# Patient Record
Sex: Male | Born: 1965 | Race: White | Hispanic: No | Marital: Married | Smoking: Current every day smoker
Health system: Southern US, Community
[De-identification: ages and names within clinical notes are randomized; demographics above are authoritative.]

## PROBLEM LIST (undated history)

## (undated) DIAGNOSIS — F32A Depression, unspecified: Secondary | ICD-10-CM

## (undated) DIAGNOSIS — G43909 Migraine, unspecified, not intractable, without status migrainosus: Secondary | ICD-10-CM

## (undated) DIAGNOSIS — R7303 Prediabetes: Secondary | ICD-10-CM

## (undated) DIAGNOSIS — E785 Hyperlipidemia, unspecified: Secondary | ICD-10-CM

## (undated) DIAGNOSIS — Z720 Tobacco use: Secondary | ICD-10-CM

---

## 2005-06-20 ENCOUNTER — Emergency Department: Payer: Self-pay | Admitting: Unknown Physician Specialty

## 2007-12-19 ENCOUNTER — Emergency Department: Payer: Self-pay | Admitting: Emergency Medicine

## 2008-06-18 ENCOUNTER — Emergency Department: Payer: Self-pay

## 2009-10-07 ENCOUNTER — Emergency Department: Payer: Self-pay | Admitting: Emergency Medicine

## 2011-12-18 ENCOUNTER — Emergency Department: Payer: Self-pay | Admitting: Emergency Medicine

## 2014-05-24 ENCOUNTER — Emergency Department: Payer: Self-pay | Admitting: Emergency Medicine

## 2014-05-25 LAB — BASIC METABOLIC PANEL
Anion Gap: 12 (ref 7–16)
BUN: 10 mg/dL (ref 7–18)
CHLORIDE: 108 mmol/L — AB (ref 98–107)
CO2: 25 mmol/L (ref 21–32)
Calcium, Total: 8.5 mg/dL (ref 8.5–10.1)
Creatinine: 1.45 mg/dL — ABNORMAL HIGH (ref 0.60–1.30)
EGFR (African American): >60
EGFR (Non-African Amer.): 55 — ABNORMAL LOW
Glucose: 81 mg/dL (ref 65–99)
Osmolality: 287 (ref 275–301)
Potassium: 3.5 mmol/L (ref 3.5–5.1)
Sodium: 145 mmol/L (ref 136–145)

## 2014-05-25 LAB — CBC
HCT: 47.6 % (ref 40.0–52.0)
HGB: 16.3 g/dL (ref 13.0–18.0)
MCH: 33.7 pg (ref 26.0–34.0)
MCHC: 34.3 g/dL (ref 32.0–36.0)
MCV: 98 fL (ref 80–100)
Platelet: 254 10*3/uL (ref 150–440)
RBC: 4.85 10*6/uL (ref 4.40–5.90)
RDW: 12.8 % (ref 11.5–14.5)
WBC: 9.1 10*3/uL (ref 3.8–10.6)

## 2014-05-25 LAB — TROPONIN I

## 2014-05-25 LAB — ETHANOL: Ethanol: 194 mg/dL (ref 0–80)

## 2014-05-26 ENCOUNTER — Emergency Department: Payer: Self-pay | Admitting: Emergency Medicine

## 2014-05-26 LAB — COMPREHENSIVE METABOLIC PANEL
Albumin: 3.9 g/dL (ref 3.4–5.0)
Alkaline Phosphatase: 101 U/L
Anion Gap: 6 — ABNORMAL LOW (ref 7–16)
BUN: 11 mg/dL (ref 7–18)
Bilirubin,Total: 0.4 mg/dL (ref 0.2–1.0)
Calcium, Total: 9.2 mg/dL (ref 8.5–10.1)
Chloride: 105 mmol/L (ref 98–107)
Co2: 26 mmol/L (ref 21–32)
Creatinine: 0.87 mg/dL (ref 0.60–1.30)
EGFR (African American): >60
EGFR (Non-African Amer.): >60
Glucose: 95 mg/dL (ref 65–99)
Osmolality: 273 (ref 275–301)
Potassium: 4.8 mmol/L (ref 3.5–5.1)
SGOT(AST): 31 U/L (ref 15–37)
SGPT (ALT): 32 U/L
Sodium: 137 mmol/L (ref 136–145)
Total Protein: 7.8 g/dL (ref 6.4–8.2)

## 2014-05-26 LAB — LIPASE, BLOOD: Lipase: 193 U/L (ref 73–393)

## 2014-05-26 LAB — CBC
HCT: 46.9 % (ref 40.0–52.0)
HGB: 15.6 g/dL (ref 13.0–18.0)
MCH: 33.2 pg (ref 26.0–34.0)
MCHC: 33.3 g/dL (ref 32.0–36.0)
MCV: 100 fL (ref 80–100)
PLATELETS: 221 10*3/uL (ref 150–440)
RBC: 4.71 10*6/uL (ref 4.40–5.90)
RDW: 12.8 % (ref 11.5–14.5)
WBC: 11.9 10*3/uL — AB (ref 3.8–10.6)

## 2014-05-26 LAB — MAGNESIUM: Magnesium: 2.1 mg/dL

## 2017-03-30 ENCOUNTER — Emergency Department
Admission: EM | Admit: 2017-03-30 | Discharge: 2017-03-30 | Disposition: A | Discharge status: Home / Self Care | Payer: Non-veteran care | Attending: Emergency Medicine | Admitting: Emergency Medicine

## 2017-03-30 ENCOUNTER — Encounter: Payer: Self-pay | Admitting: *Deleted

## 2017-03-30 ENCOUNTER — Emergency Department: Payer: Non-veteran care

## 2017-03-30 DIAGNOSIS — R51 Headache: Secondary | ICD-10-CM | POA: Diagnosis not present

## 2017-03-30 DIAGNOSIS — F17210 Nicotine dependence, cigarettes, uncomplicated: Secondary | ICD-10-CM | POA: Insufficient documentation

## 2017-03-30 DIAGNOSIS — M6281 Muscle weakness (generalized): Secondary | ICD-10-CM | POA: Diagnosis present

## 2017-03-30 DIAGNOSIS — R634 Abnormal weight loss: Secondary | ICD-10-CM | POA: Insufficient documentation

## 2017-03-30 DIAGNOSIS — R5383 Other fatigue: Secondary | ICD-10-CM | POA: Diagnosis not present

## 2017-03-30 DIAGNOSIS — D3501 Benign neoplasm of right adrenal gland: Secondary | ICD-10-CM | POA: Diagnosis not present

## 2017-03-30 DIAGNOSIS — D3500 Benign neoplasm of unspecified adrenal gland: Secondary | ICD-10-CM

## 2017-03-30 LAB — COMPREHENSIVE METABOLIC PANEL
ALT: 13 U/L — ABNORMAL LOW (ref 17–63)
ANION GAP: 8 (ref 5–15)
AST: 20 U/L (ref 15–41)
Albumin: 4.3 g/dL (ref 3.5–5.0)
Alkaline Phosphatase: 66 U/L (ref 38–126)
BUN: 15 mg/dL (ref 6–20)
CHLORIDE: 106 mmol/L (ref 101–111)
CO2: 25 mmol/L (ref 22–32)
Calcium: 9.1 mg/dL (ref 8.9–10.3)
Creatinine, Ser: 0.95 mg/dL (ref 0.61–1.24)
Glucose, Bld: 110 mg/dL — ABNORMAL HIGH (ref 65–99)
POTASSIUM: 3.9 mmol/L (ref 3.5–5.1)
SODIUM: 139 mmol/L (ref 135–145)
Total Bilirubin: 0.6 mg/dL (ref 0.3–1.2)
Total Protein: 7.4 g/dL (ref 6.5–8.1)

## 2017-03-30 LAB — CBC WITH DIFFERENTIAL/PLATELET
BASOS ABS: 0 10*3/uL (ref 0–0.1)
Basophils Relative: 1 %
EOS PCT: 2 %
Eosinophils Absolute: 0.2 10*3/uL (ref 0–0.7)
HCT: 42 % (ref 40.0–52.0)
Hemoglobin: 14.9 g/dL (ref 13.0–18.0)
LYMPHS ABS: 1.3 10*3/uL (ref 1.0–3.6)
LYMPHS PCT: 16 %
MCH: 33.5 pg (ref 26.0–34.0)
MCHC: 35.5 g/dL (ref 32.0–36.0)
MCV: 94.5 fL (ref 80.0–100.0)
MONO ABS: 0.5 10*3/uL (ref 0.2–1.0)
Monocytes Relative: 6 %
Neutro Abs: 6.5 10*3/uL (ref 1.4–6.5)
Neutrophils Relative %: 75 %
PLATELETS: 253 10*3/uL (ref 150–440)
RBC: 4.44 MIL/uL (ref 4.40–5.90)
RDW: 12.5 % (ref 11.5–14.5)
WBC: 8.6 10*3/uL (ref 3.8–10.6)

## 2017-03-30 LAB — URINALYSIS, COMPLETE (UACMP) WITH MICROSCOPIC
BILIRUBIN URINE: NEGATIVE
Bacteria, UA: NONE SEEN
GLUCOSE, UA: NEGATIVE mg/dL
HGB URINE DIPSTICK: NEGATIVE
KETONES UR: NEGATIVE mg/dL
LEUKOCYTES UA: NEGATIVE
NITRITE: NEGATIVE
PH: 6 (ref 5.0–8.0)
Protein, ur: NEGATIVE mg/dL
RBC / HPF: NONE SEEN RBC/hpf (ref 0–5)
SQUAMOUS EPITHELIAL / LPF: NONE SEEN
Specific Gravity, Urine: 1.006 (ref 1.005–1.030)

## 2017-03-30 LAB — TROPONIN I: Troponin I: <0.03 ng/mL (ref <0.03)

## 2017-03-30 LAB — MAGNESIUM: MAGNESIUM: 2.1 mg/dL (ref 1.7–2.4)

## 2017-03-30 LAB — ETHANOL

## 2017-03-30 MED ORDER — IOPAMIDOL (ISOVUE-300) INJECTION 61%
100.0000 mL | Freq: Once | INTRAVENOUS | Status: AC | PRN
  Administered 2017-03-30: 100 mL via INTRAVENOUS

## 2017-03-30 MED ORDER — IOPAMIDOL (ISOVUE-300) INJECTION 61%
30.0000 mL | Freq: Once | INTRAVENOUS | Status: AC | PRN
  Administered 2017-03-30: 30 mL via ORAL

## 2017-03-30 NOTE — Discharge Instructions (Signed)
We are very reassured by your findings here, we do noticed that you have what is likely benign adrenal adenomas, this will require follow-up with our primary care doctor. Also, you  likely should have your thyroid test performed. He would prefer not to have that done here at this time, your doctor can certainly do this for you.At this time, we see no evidence of acute cancer or other pathology causing you to lose weight, however, we do recommend close outpatient follow-up. If you have any chest pain shortness of breath abdominal pain vomiting fever or you feel worse in any way please return to medical attention

## 2017-03-30 NOTE — ED Notes (Signed)
Patient transported to CT 

## 2017-03-30 NOTE — ED Provider Notes (Addendum)
[facility] Emergency Department Provider Note  ____________________________________________   I have reviewed the triage vital signs and the nursing notes.   HISTORY  Chief Complaint Weakness and Fatigue    HPI Eric Garza is a 51 y.o. male Who states he has no significant medical problem is. He is a smoker. He is a Psychologist, clinical. He states that he has lost 40 pounds without any effort in the last 6 months. He states he has had no melena bright red blood per rectum no hematemesis no chest pain or short of breath or cough. He states he sometimes has headaches and he has not had any headaches for several weeks that he did have a cluster headaches several weeks ago.he has not had any day since then. He denies any stiff neck fever or chills. Patient states he feels generally weak but not specifically weak in any one part of his body. He went to the [place] several times to have this evaluated, they have not figured it out. Apparently did do blood work 2 days ago and did not tell the results yet and they did an x-ray yesterday the results of which he does not know. [facility] does not participate in the Federal mandated sharing of information that applied other hospitals and this information is not available.     History reviewed. No pertinent past medical history.  There are no active problems to display for this patient.   History reviewed. No pertinent surgical history.  Prior to Admission medications   Not on File    Allergies Patient has no known allergies.  No family history on file.  Social History Social History  Substance Use Topics  . Smoking status: Current Every Day Smoker    Packs/day: 1.00    Types: Cigarettes  . Smokeless tobacco: Not on file  . Alcohol use No    Review of Systems Constitutional: No fever/chills Eyes: No visual changes. ENT: No sore throat. No stiff neck no neck pain Cardiovascular: Denies chest pain. Respiratory: Denies shortness of  breath. Gastrointestinal:   no vomiting.  No diarrhea.  No constipation. Genitourinary: Negative for dysuria. Musculoskeletal: Negative lower extremity swelling Skin: Negative for rash. Neurological: Negative for severe headaches, focal weakness or numbness.   ____________________________________________   PHYSICAL EXAM:  VITAL SIGNS: ED Triage Vitals  Enc Vitals Group     BP 03/30/17 0742 (!) 157/97     Pulse Rate 03/30/17 0742 83     Resp 03/30/17 0742 18     Temp 03/30/17 0742 98 F (36.7 C)     Temp Source 03/30/17 0742 Oral     SpO2 03/30/17 0742 98 %     Weight 03/30/17 0743 153 lb (69.4 kg)     Height 03/30/17 0743 5\' 7"  (1.702 m)     Head Circumference --      Peak Flow --      Pain Score 03/30/17 0800 0     Pain Loc --      Pain Edu? --      Excl. in [place]? --     Constitutional: Alert and oriented. Well appearing and in no acute distress. Eyes: Conjunctivae are normal Head: Atraumatic HEENT: No congestion/rhinnorhea. Mucous membranes are moist.  Oropharynx non-erythematous Neck:   Nontender with no meningismus, no masses, no stridor Cardiovascular: Normal rate, regular rhythm. Grossly normal heart sounds.  Good peripheral circulation. Respiratory: Normal respiratory effort.  No retractions. Lungs CTAB. Abdominal: Soft and nontender. No distention. No  guarding no rebound Back:  There is no focal tenderness or step off.  there is no midline tenderness there are no lesions noted. there is no CVA tenderness GU: Normal showman genitalia no lesions masses or pain Musculoskeletal: No lower extremity tenderness, no upper extremity tenderness. No joint effusions, no DVT signs strong distal pulses no edema Neurologic:  Normal speech and language. No gross focal neurologic deficits are appreciated.  Skin:  Skin is warm, dry and intact. No rash noted. Psychiatric: Mood and affect are normal. Speech and behavior are normal.  ____________________________________________    LABS (all labs ordered are listed, but only abnormal results are displayed)  Labs Reviewed  COMPREHENSIVE METABOLIC PANEL - Abnormal; Notable for the following:       Result Value   Glucose, Bld 110 (*)    ALT 13 (*)    All other components within normal limits  URINALYSIS, COMPLETE (UACMP) WITH MICROSCOPIC - Abnormal; Notable for the following:    Color, Urine YELLOW (*)    APPearance CLEAR (*)    All other components within normal limits  CBC WITH DIFFERENTIAL/PLATELET  MAGNESIUM  ETHANOL   ____________________________________________  EKG  I personally interpreted any EKGs ordered by me or triage Sinus rhythm at 72 bpm normal axis no acute ST elevation or depression, PACs noted, nonspecific ST changes no acute ischemia ____________________________________________  RADIOLOGY  I reviewed any imaging ordered by me or triage that were performed during my shift and, if possible, patient and/or family made aware of any abnormal findings. ____________________________________________   PROCEDURES  Procedure(s) performed: None  Procedures  Critical Care performed: None  ____________________________________________   INITIAL IMPRESSION / ASSESSMENT AND PLAN / ED COURSE  Pertinent labs & imaging results that were available during my care of the patient were reviewed by me and considered in my medical decision making (see chart for details).  Patient here with generalized weakness and 40 pound weight loss over the course of 6 months. Concern for cancer especially given his history of tobacco abuse. Blood work is reassuring vital signs are reassuring exam is reassuring,patient apparently saw having frustration with his primary care doctor who is not been able to figure this out. We will obtain diffuse imaging CT of the head for hisheadaches, CT of the chest because of weight loss and questionable lesion on chest x-ray, CT the abdomen and pelvis for weight loss and will  reassess.  ----------------------------------------- 12:05 PM on 03/30/2017 -----------------------------------------  CT head, chest and pelvis all negative for cancer or other cause of patient's weight loss. Patient denies depression SI or HI, he may need thyroid testing that he would prefer not to stay for that today, he would like to go home. He will follow up with the [place] for that. He is made aware of all the findings on CT scan. He is in no acute distress and continues to have no complaints or concerns. We will discharge him. I did send a troponin as a precaution, but I have low suspicion that this weight loss represents ACS, nor is there any signs of ischemic gut. Additionally, glucoseis 110.    ____________________________________________   FINAL CLINICAL IMPRESSION(S) / ED DIAGNOSES  Final diagnoses:  None      This chart was dictated using voice recognition software.  Despite best efforts to proofread,  errors can occur which can change meaning.      Schuyler Amor, MD 03/30/17 1104    Schuyler Amor, MD 03/30/17 [phone]

## 2017-03-30 NOTE — ED Notes (Signed)
Labs sent to lab

## 2017-03-30 NOTE — ED Triage Notes (Signed)
Pt complains of generalized weakness for 3 weeks, pt reports a 40 pound weight loss in the last 6 months, pt is currently being seen at the [place], pt reports drinking large amounts of ETOH until 1 year ago, pt reports having labs yesterday at the [place], pt denies pain, pt states " the [place] is not helping me"

## 2017-07-04 ENCOUNTER — Emergency Department
Admission: EM | Admit: 2017-07-04 | Discharge: 2017-07-04 | Disposition: A | Discharge status: Home / Self Care | Payer: Non-veteran care | Attending: Emergency Medicine | Admitting: Emergency Medicine

## 2017-07-04 ENCOUNTER — Other Ambulatory Visit: Payer: Self-pay

## 2017-07-04 ENCOUNTER — Emergency Department: Payer: Non-veteran care

## 2017-07-04 ENCOUNTER — Encounter: Payer: Self-pay | Admitting: *Deleted

## 2017-07-04 DIAGNOSIS — Y829 Unspecified medical devices associated with adverse incidents: Secondary | ICD-10-CM | POA: Insufficient documentation

## 2017-07-04 DIAGNOSIS — T887XXA Unspecified adverse effect of drug or medicament, initial encounter: Secondary | ICD-10-CM | POA: Diagnosis not present

## 2017-07-04 DIAGNOSIS — R079 Chest pain, unspecified: Secondary | ICD-10-CM | POA: Diagnosis present

## 2017-07-04 DIAGNOSIS — F17210 Nicotine dependence, cigarettes, uncomplicated: Secondary | ICD-10-CM | POA: Diagnosis not present

## 2017-07-04 DIAGNOSIS — T402X5A Adverse effect of other opioids, initial encounter: Secondary | ICD-10-CM | POA: Diagnosis not present

## 2017-07-04 DIAGNOSIS — R0789 Other chest pain: Secondary | ICD-10-CM

## 2017-07-04 LAB — CBC
HEMATOCRIT: 39.6 % — AB (ref 40.0–52.0)
Hemoglobin: 13.9 g/dL (ref 13.0–18.0)
MCH: 33.3 pg (ref 26.0–34.0)
MCHC: 35 g/dL (ref 32.0–36.0)
MCV: 95 fL (ref 80.0–100.0)
Platelets: 267 10*3/uL (ref 150–440)
RBC: 4.17 MIL/uL — ABNORMAL LOW (ref 4.40–5.90)
RDW: 12.5 % (ref 11.5–14.5)
WBC: 12 10*3/uL — ABNORMAL HIGH (ref 3.8–10.6)

## 2017-07-04 LAB — BASIC METABOLIC PANEL
Anion gap: 8 (ref 5–15)
BUN: 18 mg/dL (ref 6–20)
CALCIUM: 9 mg/dL (ref 8.9–10.3)
CO2: 28 mmol/L (ref 22–32)
Chloride: 102 mmol/L (ref 101–111)
Creatinine, Ser: 0.99 mg/dL (ref 0.61–1.24)
GFR calc non Af Amer: >60 mL/min (ref >60)
GLUCOSE: 90 mg/dL (ref 65–99)
Potassium: 3.6 mmol/L (ref 3.5–5.1)
Sodium: 138 mmol/L (ref 135–145)

## 2017-07-04 LAB — TROPONIN I: Troponin I: <0.03 ng/mL (ref <0.03)

## 2017-07-04 MED ORDER — KETOROLAC TROMETHAMINE 10 MG PO TABS: 10.0000 mg | ORAL_TABLET | Freq: Four times a day (QID) | ORAL | 0 refills | Status: DC | PRN

## 2017-07-04 MED ORDER — KETOROLAC TROMETHAMINE 10 MG PO TABS
10.0000 mg | ORAL_TABLET | Freq: Once | ORAL | Status: AC
  Administered 2017-07-04: 10 mg via ORAL
  Filled 2017-07-04: qty 1

## 2017-07-04 NOTE — ED Provider Notes (Signed)
[facility] Emergency Department Provider Note  ____________________________________________  Time seen: Approximately 9:51 PM  I have reviewed the triage vital signs and the nursing notes.   HISTORY  Chief Complaint Chest Pain and Shortness of Breath    HPI Eric Garza is a 51 y.o. male with a history of PTSD and right carpal tunnel surgery earlier today who complained of an onset of chest pain or shortness of breath around 6:30 PM today. Patient had uneventful outpatient procedure at the [place], return home. He was prescribed oxycodone 5 mg tablets to take 1 as needed. However, around 4:00 today, he took 2 tablets for his first dose. He then developed the chest pain and shortness of breath. The chest pain is described as tightness, nonradiating, no aggravating or alleviating factors. No diaphoresis or vomiting.  Apart from the recent outpatient surgery today, no recent travel trauma or hospitalizations. No history of DVT or PE.  While waiting to be seen, the patient reports that his symptoms have completely resolved. He is asymptomatic at this time. Concurrently, his right wrist pain at the surgical site is getting worse again      Past medical history PTSD    Past surgical history Right carpal tunnel release  Prior to Admission medications   Medication Sig Start Date End Date Taking? Authorizing Provider  ketorolac (TORADOL) 10 MG tablet Take 1 tablet (10 mg total) by mouth every 6 (six) hours as needed for moderate pain. 07/04/17   Carrie Mew, MD     Allergies Patient has no known allergies.   No family history on file.  Social History Social History   Tobacco Use  . Smoking status: Current Every Garza Smoker    Packs/Garza: 1.00    Types: Cigarettes  . Smokeless tobacco: Never Used  Substance Use Topics  . Alcohol use: No  . Drug use: Not on file    Review of Systems  Constitutional:   No fever or chills.  ENT:   No sore throat.  No rhinorrhea. Cardiovascular:  Positive as above chest pain without palpitations or syncope. Respiratory:   Positive as above shortness of breath without cough. Gastrointestinal:   Negative for abdominal pain, vomiting and diarrhea.  Musculoskeletal:   Negative for focal pain or swelling All other systems reviewed and are negative except as documented above in ROS and HPI.  ____________________________________________   PHYSICAL EXAM:  VITAL SIGNS: ED Triage Vitals  Enc Vitals Group     BP 07/04/17 1911 (!) 151/96     Pulse Rate 07/04/17 1911 71     Resp 07/04/17 1911 20     Temp 07/04/17 1911 97.8 F (36.6 C)     Temp Source 07/04/17 1911 Oral     SpO2 07/04/17 1911 100 %     Weight 07/04/17 1911 175 lb (79.4 kg)     Height 07/04/17 1911 5\' 7"  (1.702 m)     Head Circumference --      Peak Flow --      Pain Score 07/04/17 2035 0     Pain Loc --      Pain Edu? --      Excl. in [place]? --     Vital signs reviewed, nursing assessments reviewed.   Constitutional:   Alert and oriented. Well appearing and in no distress. Eyes:   No scleral icterus.  EOMI. No nystagmus. No conjunctival pallor. PERRL. ENT   Head:   Normocephalic and atraumatic.   Nose:  No congestion/rhinnorhea.    Mouth/Throat:   MMM, no pharyngeal erythema. No peritonsillar mass.    Neck:   No meningismus. Full ROM. Hematological/Lymphatic/Immunilogical:   No cervical lymphadenopathy. Cardiovascular:   RRR. Symmetric bilateral radial and DP pulses.  No murmurs.  Respiratory:   Normal respiratory effort without tachypnea/retractions. Breath sounds are clear and equal bilaterally. No wheezes/rales/rhonchi. Gastrointestinal:   Soft and nontender. Non distended. There is no CVA tenderness.  No rebound, rigidity, or guarding. Genitourinary:   deferred Musculoskeletal:   Normal range of motion in all extremities except for right wrist which is in a surgical splint and bandage. No joint effusions.  No  lower extremity tenderness.  No edema. Neurologic:   Normal speech and language.  Motor grossly intact. No gross focal neurologic deficits are appreciated.   ____________________________________________    LABS (pertinent positives/negatives) (all labs ordered are listed, but only abnormal results are displayed) Labs Reviewed  CBC - Abnormal; Notable for the following components:      Result Value   WBC 12.0 (*)    RBC 4.17 (*)    HCT 39.6 (*)    All other components within normal limits  BASIC METABOLIC PANEL  TROPONIN I   ____________________________________________   EKG  Interpreted by me Normal sinus rhythm rate of 69, normal axis and intervals. Normal QRS ST segments and T waves. Voltage criteria for LVH in the high lateral leads.  ____________________________________________    [id number]  Dg Chest 2 View  Result Date: 07/04/2017 CLINICAL DATA:  Chest pain EXAM: CHEST  2 VIEW COMPARISON:  03/30/2017 FINDINGS: Mild bronchitic changes. No consolidation or effusion. Normal heart size. No pneumothorax. Degenerative changes of the spine. IMPRESSION: No active cardiopulmonary disease.  Mild bronchitic changes. Electronically Signed   By: Donavan Foil M.D.   On: 07/04/2017 19:45    ____________________________________________   PROCEDURES Procedures  ____________________________________________     CLINICAL IMPRESSION / ASSESSMENT AND PLAN / ED COURSE  Pertinent labs & imaging results that were available during my care of the patient were reviewed by me and considered in my medical decision making (see chart for details).   Patient presents with an onset of chest pain shortness of breath, atypical, after surgery. It is not pleuritic, not typical of PE despite the recent surgery. Other surgeries and a risk factor for PE, the patient has no evidence of DVT anywhere in the body, he had a relatively minor outpatient procedure, and his symptoms have resolved at the  same time as his oxycodone is worn off. If this were PE expect that his chest pain would worsen as the oxycodone wears off. Likely this is a histamine mediated reaction occurring from taking a relatively high dose of the oxycodone in an otherwise opioid nave person. Recommended he avoid the oxycodone as much as possible, and if he must take it try a half tablet or 1 tablet. In addition, I'll have him hold his chronic naproxen and use Toradol for the next few days as this may provide better non-opioid pain management too.      ____________________________________________   FINAL CLINICAL IMPRESSION(S) / ED DIAGNOSES    Final diagnoses:  Atypical chest pain  Adverse effect of other opioids, initial encounter      This SmartLink is deprecated. Use AVSMEDLIST instead to display the medication list for a patient.   Portions of this note were generated with dragon dictation software. Dictation errors may occur despite best attempts at proofreading.    Carrie Mew,  MD 07/04/17 2156

## 2017-07-04 NOTE — ED Notes (Signed)
Pt reports he was having shortness of breath and chest tightness after taking oxycodone - pt had surgery today and [facility] they advised [facility] come to [facility] ER for eval - pt denies any pain or shortness of breath at this time

## 2017-07-04 NOTE — Discharge Instructions (Signed)
Do not take naproxen, ibuprofen, or other NSAIDS while taking toradol.

## 2017-07-04 NOTE — ED Triage Notes (Signed)
Pt to triage via wheelchair.  Pt has chest tightness and sob since 1830 today.    Pt had surgery today at the [place] on his right wrist for carpel tunnel.   Pt has a bandage on right wrist.  cig smoker.  No cough.

## 2018-09-19 IMAGING — CR DG CHEST 2V
1 series · 2 of 2 positions shown · non-contrast
Comparison: 05/25/2014

CLINICAL DATA: 51-year-old male with a history of weakness and
blurred vision. History of smoking

EXAM:
CHEST  2 VIEW

[Series 1: dg chest 2 view · 0.14mm/px · 2 of 2 slices shown]
[im 1/2]
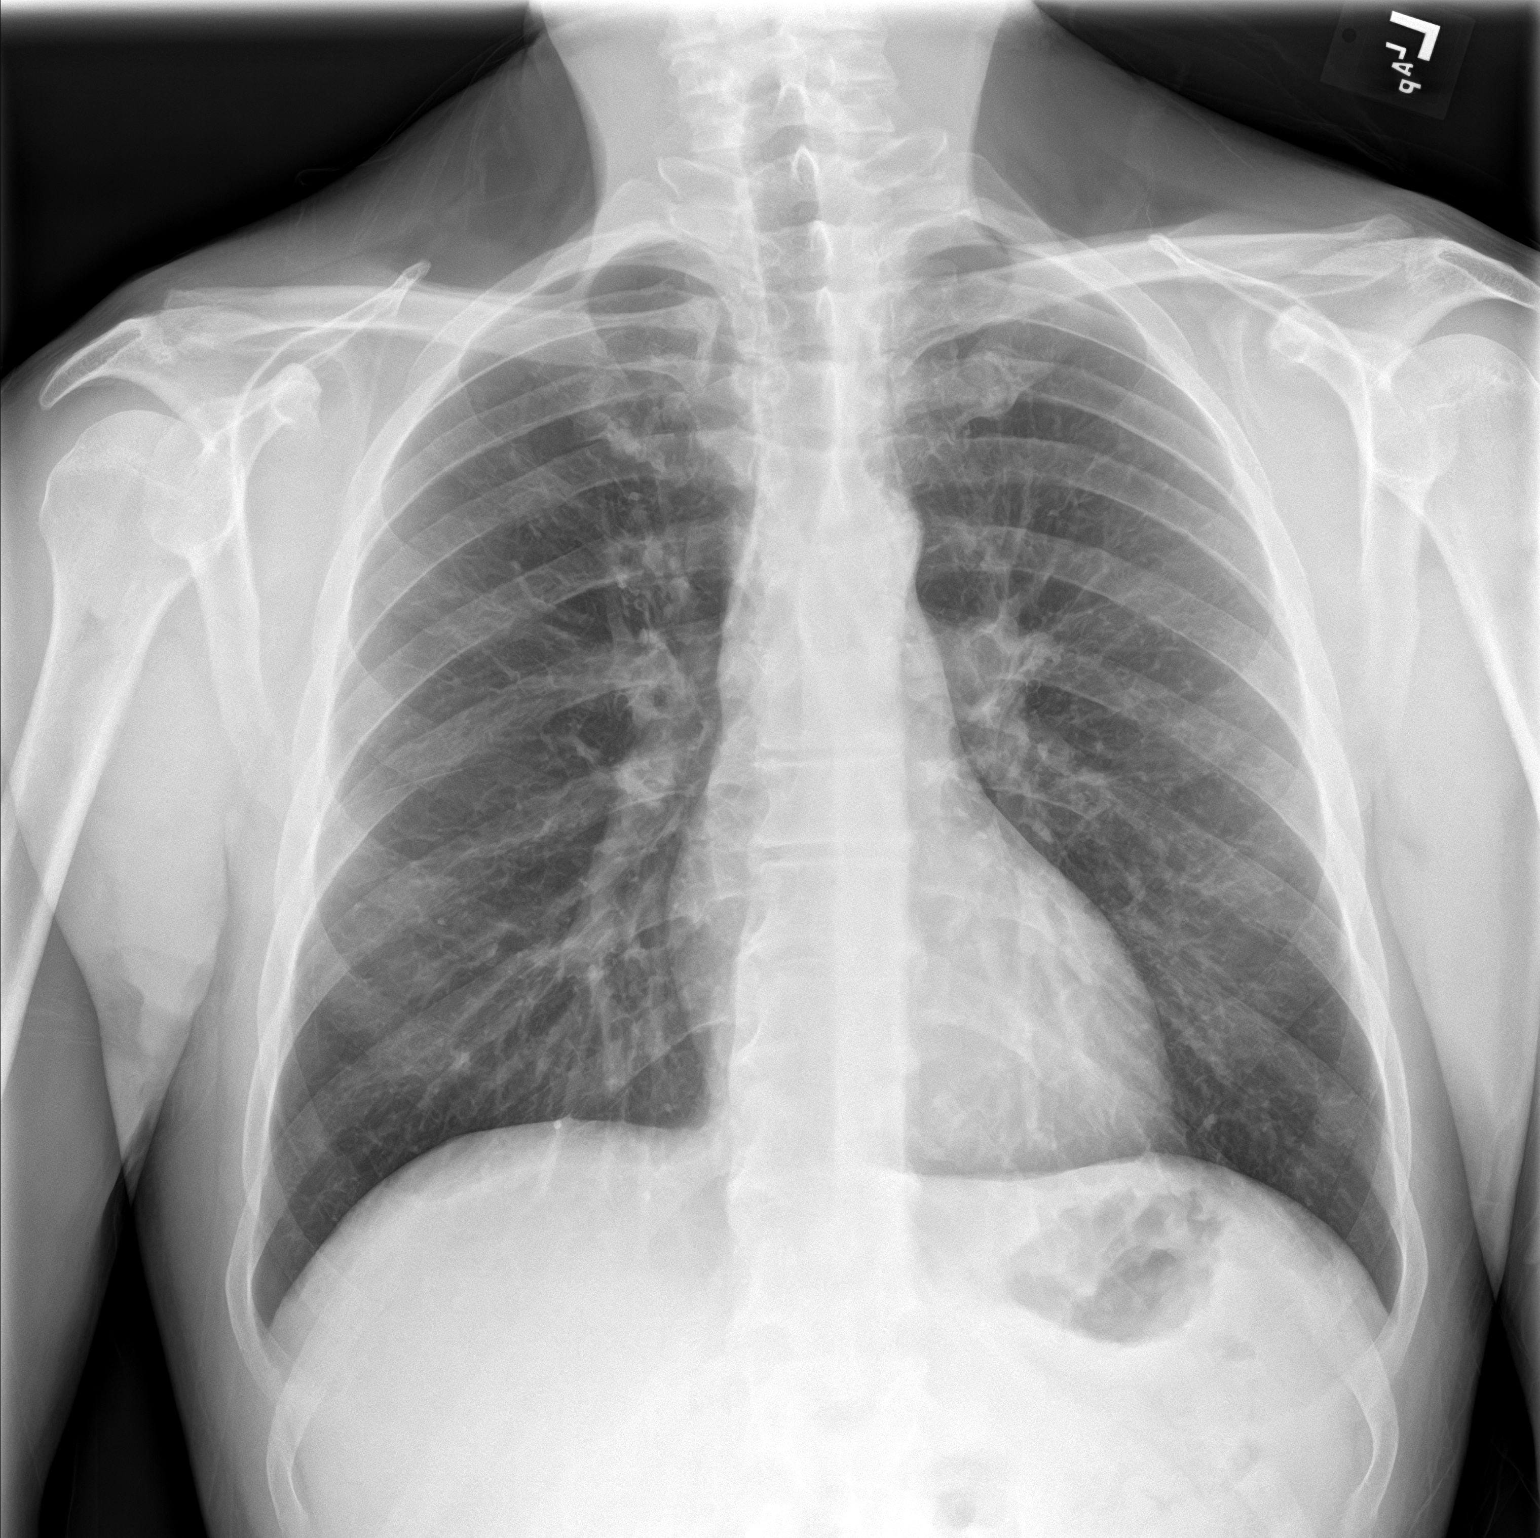
[im 2/2]
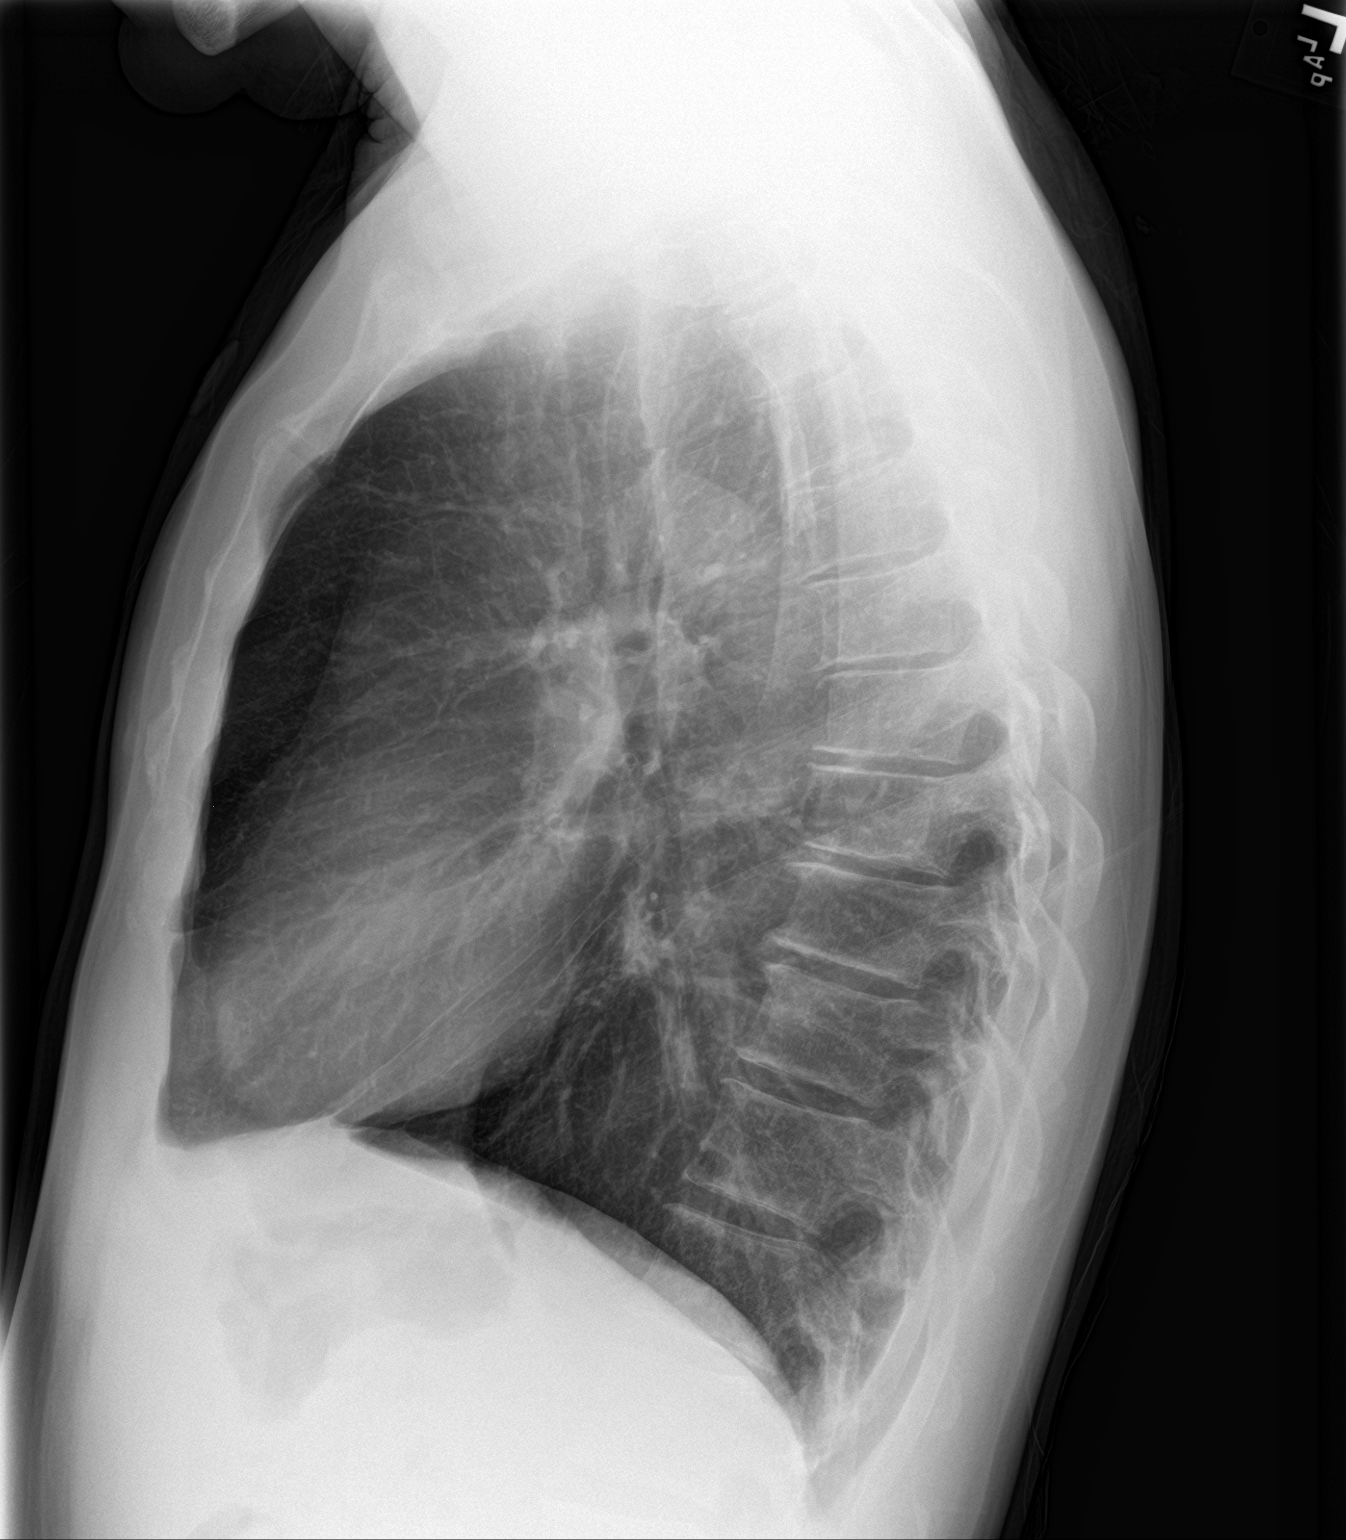

[2 of 2 positions shown; findings below may reference images not displayed]

FINDINGS: Cardiomediastinal silhouette unchanged in size and contour. No
evidence of central vascular congestion. No pneumothorax. No pleural
effusion. No confluent airspace disease.

Coarsened interstitial markings. There is questionable nodular
density at the right lower lung, in a region of overlying the
anterior fifth rib.

No displaced fracture.
IMPRESSION: No radiographic evidence of acute cardiopulmonary disease.

There is a questionable small nodule at the right lung base, which
could alternatively be related to the right rib head or chronic
scarring. Given the patient's history of smoking and chronic lung
changes, a repeat chest x-ray with apical lordotic positioning is
recommended in 4-6 weeks.

## 2018-09-19 IMAGING — CT CT HEAD W/O CM
3 series · 15 of 46 positions shown, 18 images · non-contrast
Comparison: None.

CLINICAL DATA: Generalized weakness for several weeks with weight
loss, initial encounter

EXAM:
CT HEAD WITHOUT CONTRAST
TECHNIQUE: Contiguous axial images were obtained from the base of the skull
through the vertex without intravenous contrast.

[Series 2: head wo · axial · 0.47mm/px · z∈[-107,+13]mm · 9 of 29 slices shown, 12 images]
[im 3/29  brain]
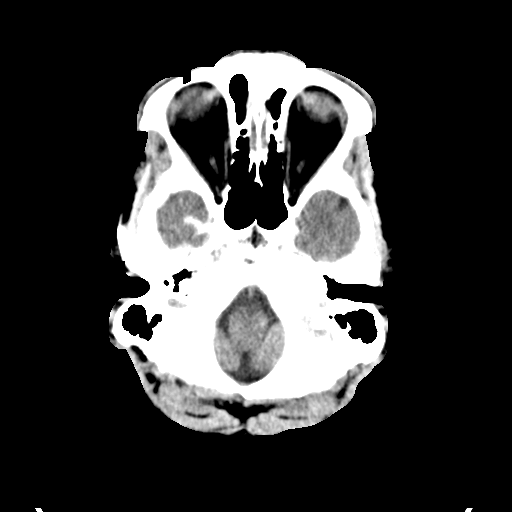
[im 3/29  bone]
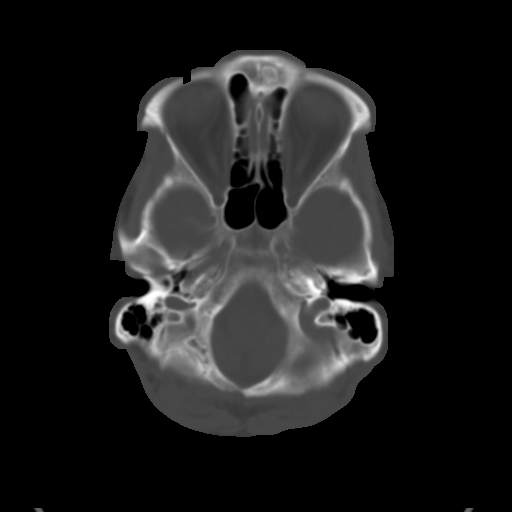
[im 6/29  brain]
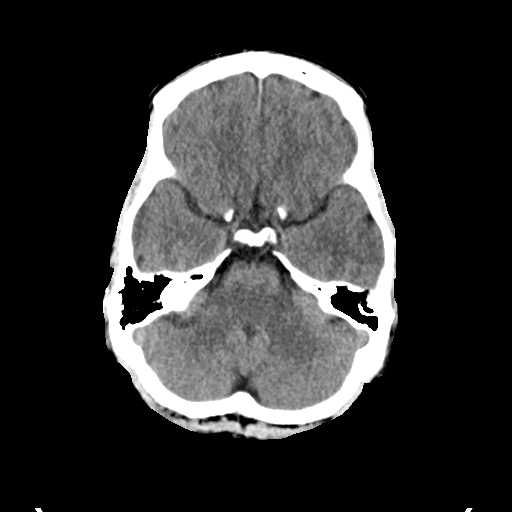
[im 9/29  brain]
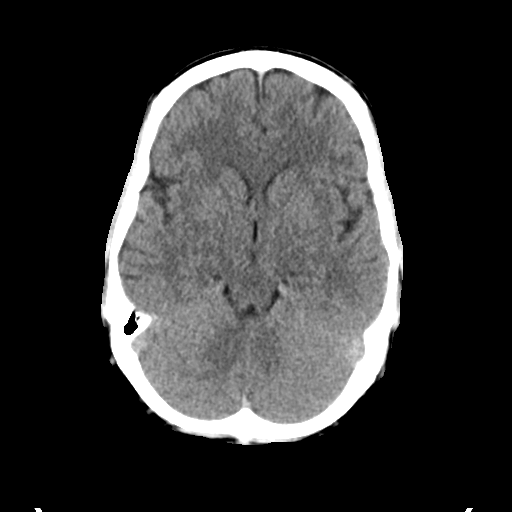
[im 12/29  brain]
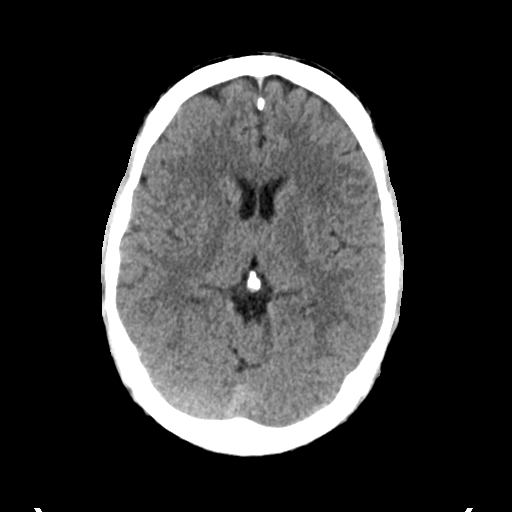
[im 15/29  brain]
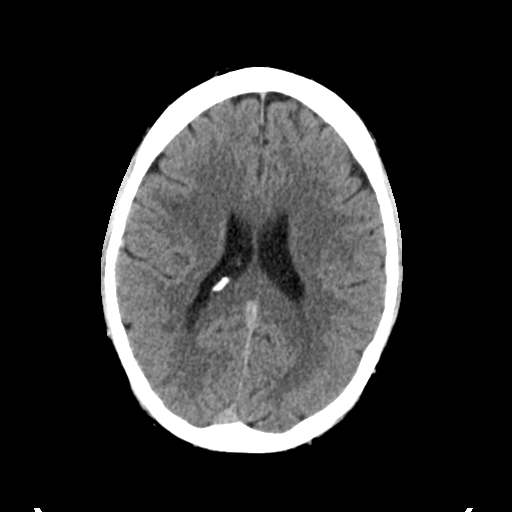
[im 15/29  bone]
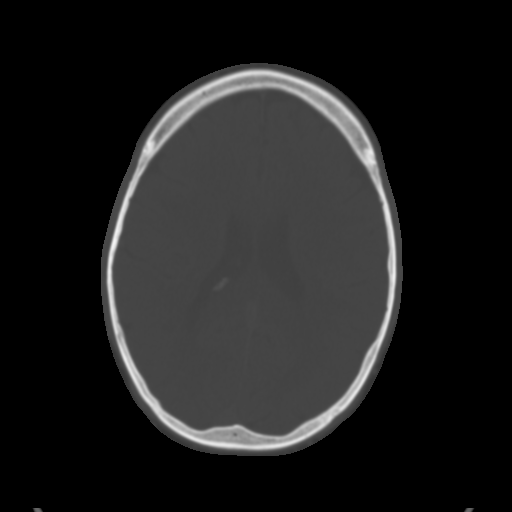
[im 18/29  brain]
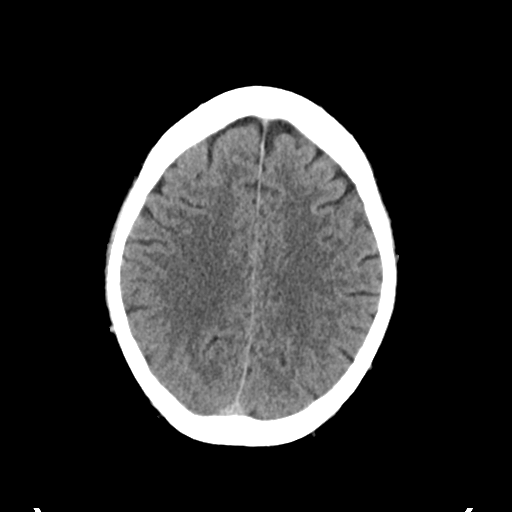
[im 21/29  brain]
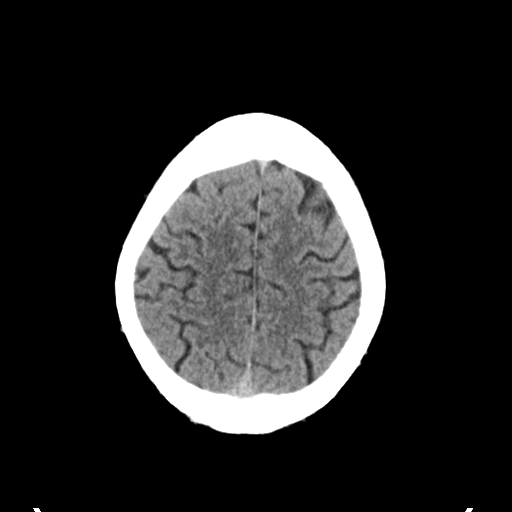
[im 24/29  brain]
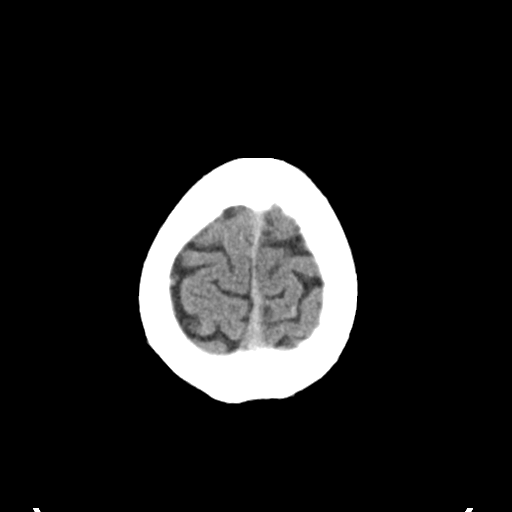
[im 27/29  brain]
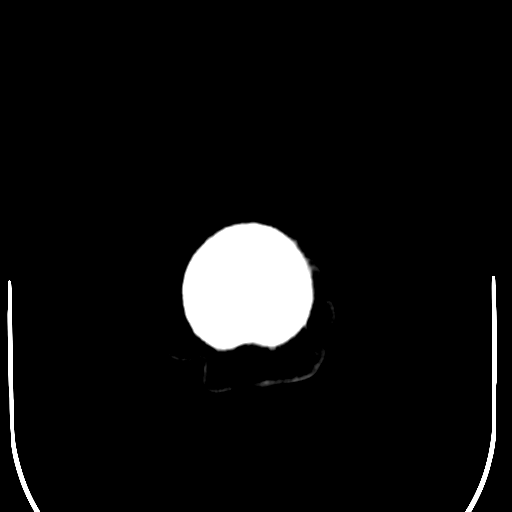
[im 27/29  bone]
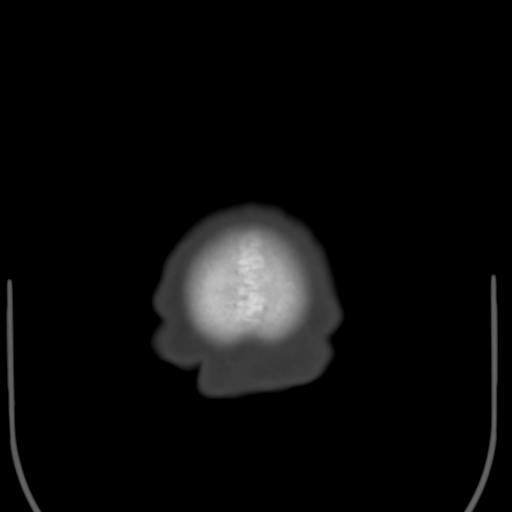

[Series 4: coronal soft tissue · coronal · 0.29mm/px · 3 of 68 slices shown]
[im 23/68  brain]
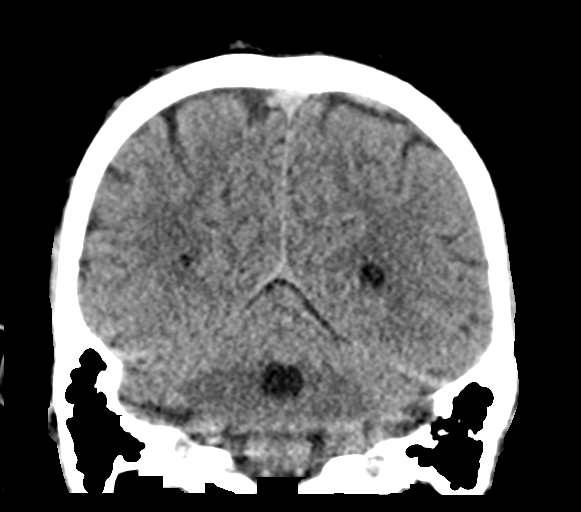
[im 30/68  brain]
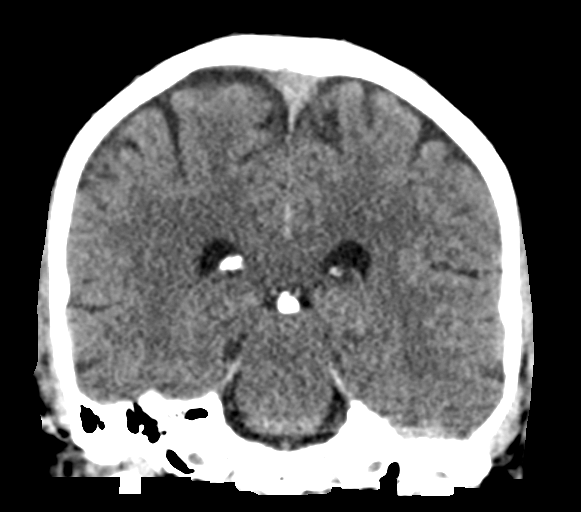
[im 38/68  brain]
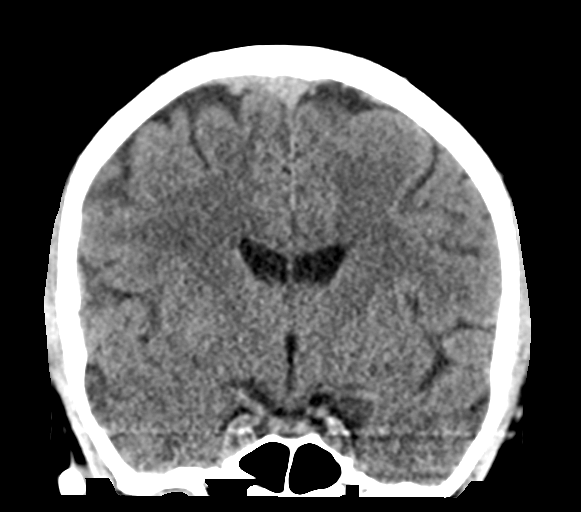

[Series 5: sagittal soft tissue · sagittal · 0.29mm/px · 3 of 56 slices shown]
[im 19/56  brain]
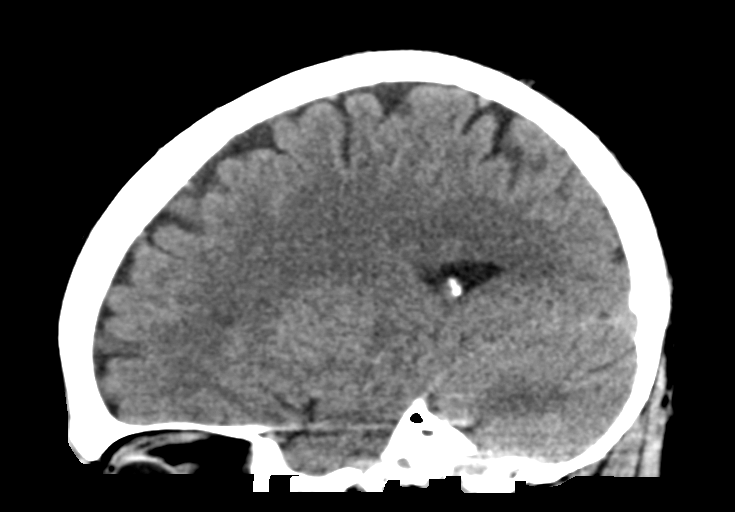
[im 28/56  brain]
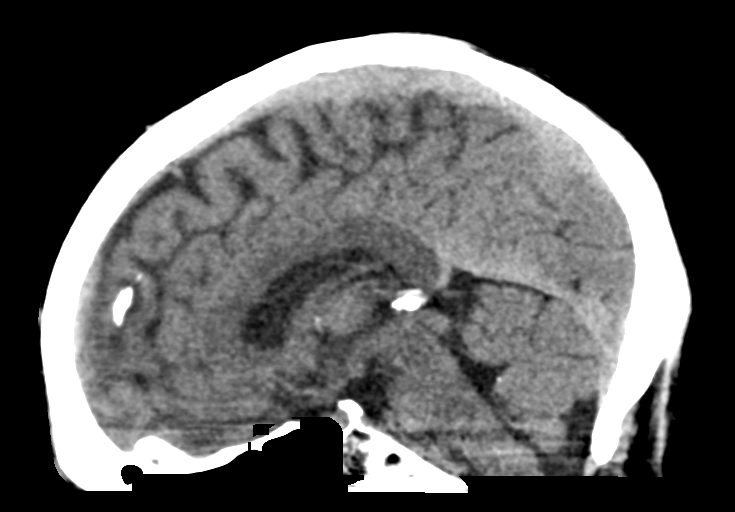
[im 37/56  brain]
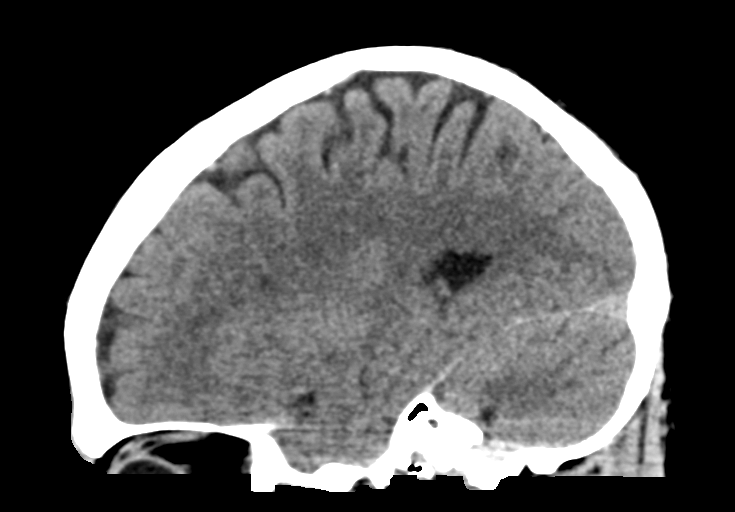

[15 of 46 positions shown; findings below may reference images not displayed]

FINDINGS: Brain: No evidence of acute infarction, hemorrhage, hydrocephalus,
extra-axial collection or mass lesion/mass effect.

Vascular: No hyperdense vessel or unexpected calcification.

Skull: Normal. Negative for fracture or focal lesion.

Sinuses/Orbits: No acute finding.

Other: None.
IMPRESSION: No acute abnormality noted.

## 2018-12-26 ENCOUNTER — Telehealth: Payer: Self-pay | Admitting: Gastroenterology

## 2018-12-26 ENCOUNTER — Telehealth: Payer: Self-pay

## 2018-12-26 NOTE — Telephone Encounter (Signed)
Returned patients call to schedule colonoscopy, however the number he provided was answered and the caller said it was the wrong number.    Thanks [employer]

## 2018-12-26 NOTE — Telephone Encounter (Signed)
Pt left vm to schedule Procedure

## 2019-01-06 ENCOUNTER — Encounter: Payer: Self-pay | Admitting: *Deleted

## 2019-05-12 ENCOUNTER — Telehealth: Payer: Self-pay

## 2019-05-12 ENCOUNTER — Other Ambulatory Visit: Payer: Self-pay

## 2019-05-12 DIAGNOSIS — Z1211 Encounter for screening for malignant neoplasm of colon: Secondary | ICD-10-CM

## 2019-05-12 NOTE — Telephone Encounter (Signed)
Gastroenterology Pre-Procedure Review  Request Date: 06/27/19 Requesting Physician: Dr. Marius Ditch  PATIENT REVIEW QUESTIONS: The patient responded to the following health history questions as indicated:    1. Are you having any GI issues? no 2. Do you have a personal history of Polyps? no 3. Do you have a family history of Colon Cancer or Polyps? no 4. Diabetes Mellitus? no 5. Joint replacements in the past 12 months?no 6. Major health problems in the past 3 months?no 7. Any artificial heart valves, MVP, or defibrillator?no    MEDICATIONS & ALLERGIES:    Patient reports the following regarding taking any anticoagulation/antiplatelet therapy:   Plavix, Coumadin, Eliquis, Xarelto, Lovenox, Pradaxa, Brilinta, or Effient? no Aspirin? no  Patient confirms/reports the following medications:  Current Outpatient Medications  Medication Sig Dispense Refill  . ketorolac (TORADOL) 10 MG tablet Take 1 tablet (10 mg total) by mouth every 6 (six) hours as needed for moderate pain. 12 tablet 0   No current facility-administered medications for this visit.     Patient confirms/reports the following allergies:  No Known Allergies  No orders of the defined types were placed in this encounter.   AUTHORIZATION INFORMATION Primary Insurance: 1D#: Group #:  Secondary Insurance: 1D#: Group #:  SCHEDULE INFORMATION: Date: 06/27/19 Time: Location:[facility]

## 2019-06-24 ENCOUNTER — Other Ambulatory Visit
Admission: RE | Admit: 2019-06-24 | Discharge: 2019-06-24 | Disposition: A | Discharge status: Home / Self Care | Payer: PRIVATE HEALTH INSURANCE | Source: Ambulatory Visit | Attending: Gastroenterology | Admitting: Gastroenterology

## 2019-06-24 ENCOUNTER — Other Ambulatory Visit: Payer: Self-pay

## 2019-06-24 DIAGNOSIS — Z01812 Encounter for preprocedural laboratory examination: Secondary | ICD-10-CM | POA: Diagnosis not present

## 2019-06-24 DIAGNOSIS — Z20828 Contact with and (suspected) exposure to other viral communicable diseases: Secondary | ICD-10-CM | POA: Diagnosis not present

## 2019-06-25 LAB — SARS CORONAVIRUS 2 (TAT 6-24 HRS): SARS Coronavirus 2: NEGATIVE

## 2019-06-27 ENCOUNTER — Ambulatory Visit: Payer: PRIVATE HEALTH INSURANCE | Admitting: Registered Nurse

## 2019-06-27 ENCOUNTER — Ambulatory Visit
Admission: RE | Admit: 2019-06-27 | Discharge: 2019-06-27 | Disposition: A | Discharge status: Home / Self Care | Payer: PRIVATE HEALTH INSURANCE | Attending: Gastroenterology | Admitting: Gastroenterology

## 2019-06-27 ENCOUNTER — Encounter
Admission: RE | Disposition: A | Discharge status: Home / Self Care | Payer: Self-pay | Source: Home / Self Care | Attending: Gastroenterology

## 2019-06-27 ENCOUNTER — Other Ambulatory Visit: Payer: Self-pay

## 2019-06-27 DIAGNOSIS — F17210 Nicotine dependence, cigarettes, uncomplicated: Secondary | ICD-10-CM | POA: Diagnosis not present

## 2019-06-27 DIAGNOSIS — R7303 Prediabetes: Secondary | ICD-10-CM | POA: Diagnosis not present

## 2019-06-27 DIAGNOSIS — Z1211 Encounter for screening for malignant neoplasm of colon: Secondary | ICD-10-CM | POA: Diagnosis not present

## 2019-06-27 DIAGNOSIS — K621 Rectal polyp: Secondary | ICD-10-CM

## 2019-06-27 DIAGNOSIS — Z79899 Other long term (current) drug therapy: Secondary | ICD-10-CM | POA: Insufficient documentation

## 2019-06-27 HISTORY — DX: Prediabetes: R73.03

## 2019-06-27 HISTORY — PX: COLONOSCOPY WITH PROPOFOL: SHX5780

## 2019-06-27 SURGERY — COLONOSCOPY WITH PROPOFOL
Anesthesia: General

## 2019-06-27 MED ORDER — LIDOCAINE HCL (PF) 2 % IJ SOLN
INTRAMUSCULAR | Status: AC
  Filled 2019-06-27: qty 10

## 2019-06-27 MED ORDER — PROPOFOL 500 MG/50ML IV EMUL
INTRAVENOUS | Status: AC
  Filled 2019-06-27: qty 50

## 2019-06-27 MED ORDER — SODIUM CHLORIDE 0.9 % IV SOLN
INTRAVENOUS | Status: DC
  Administered 2019-06-27: 08:00:00 via INTRAVENOUS

## 2019-06-27 MED ORDER — PROPOFOL 500 MG/50ML IV EMUL
INTRAVENOUS | Status: DC | PRN
  Administered 2019-06-27: 50 mg via INTRAVENOUS
  Administered 2019-06-27: 20 mg via INTRAVENOUS

## 2019-06-27 MED ORDER — LIDOCAINE HCL (CARDIAC) PF 100 MG/5ML IV SOSY
PREFILLED_SYRINGE | INTRAVENOUS | Status: DC | PRN
  Administered 2019-06-27: 60 mg via INTRAVENOUS

## 2019-06-27 MED ORDER — PROPOFOL 500 MG/50ML IV EMUL
INTRAVENOUS | Status: DC | PRN
  Administered 2019-06-27: 100 ug/kg/min via INTRAVENOUS

## 2019-06-27 NOTE — Anesthesia Postprocedure Evaluation (Signed)
Anesthesia Post Note  Patient: Eric Garza  Procedure(s) Performed: COLONOSCOPY WITH PROPOFOL (N/A )  Patient location during evaluation: PACU Anesthesia Type: General Level of consciousness: awake and alert Pain management: pain level controlled Vital Signs Assessment: post-procedure vital signs reviewed and stable Respiratory status: spontaneous breathing, nonlabored ventilation, respiratory function stable and patient connected to nasal cannula oxygen Cardiovascular status: blood pressure returned to baseline and stable Postop Assessment: no apparent nausea or vomiting Anesthetic complications: no     Last Vitals:  Vitals:   06/27/19 0844 06/27/19 0854  BP: 105/80 114/89  Pulse: 67 66  Resp: (!) 21 19  Temp:    SpO2: 98% 100%    Last Pain:  Vitals:   06/27/19 0854  TempSrc:   PainSc: 0-No pain                 Molli Barrows

## 2019-06-27 NOTE — Anesthesia Preprocedure Evaluation (Signed)
Anesthesia Evaluation  Patient identified by MRN, date of birth, ID band Patient awake    Reviewed: Allergy & Precautions, H&P , NPO status , Patient's Chart, lab work & pertinent test results, reviewed documented beta blocker date and time   Airway Mallampati: II   Neck ROM: full    Dental  (+) Poor Dentition   Pulmonary neg pulmonary ROS, Current SmokerPatient did not abstain from smoking.,    Pulmonary exam normal        Cardiovascular Exercise Tolerance: Good negative cardio ROS Normal cardiovascular exam Rhythm:regular Rate:Normal     Neuro/Psych negative neurological ROS  negative psych ROS   GI/Hepatic negative GI ROS, Neg liver ROS,   Endo/Other  negative endocrine ROS  Renal/GU negative Renal ROS  negative genitourinary   Musculoskeletal   Abdominal   Peds  Hematology negative hematology ROS (+)   Anesthesia Other Findings Past Medical History: No date: Pre-diabetes History reviewed. No pertinent surgical history. BMI    Body Mass Index: 29.44 kg/m     Reproductive/Obstetrics negative OB ROS                             Anesthesia Physical Anesthesia Plan  ASA: II  Anesthesia Plan: General   Post-op Pain Management:    Induction:   PONV Risk Score and Plan:   Airway Management Planned:   Additional Equipment:   Intra-op Plan:   Post-operative Plan:   Informed Consent: I have reviewed the patients History and Physical, chart, labs and discussed the procedure including the risks, benefits and alternatives for the proposed anesthesia with the patient or authorized representative who has indicated his/her understanding and acceptance.     Dental Advisory Given  Plan Discussed with: CRNA  Anesthesia Plan Comments:         Anesthesia Quick Evaluation

## 2019-06-27 NOTE — H&P (Signed)
Cephas Darby, MD [address]  [place], [place] [address]  Main: [phone]  Fax: [phone] Pager: [phone]  Primary Care Physician:  Idelle Crouch, MD Primary Gastroenterologist:  Dr. Cephas Darby  Pre-Procedure History & Physical: HPI:  Eric Garza is a 53 y.o. male is here for an colonoscopy.   Past Medical History:  Diagnosis Date  . Pre-diabetes     History reviewed. No pertinent surgical history.  Prior to Admission medications   Medication Sig Start Date End Date Taking? Authorizing Provider  cetirizine (ZYRTEC) 10 MG tablet Take 10 mg by mouth daily.   Yes [provider]  cyclobenzaprine (FLEXERIL) 10 MG tablet Take 10 mg by mouth at bedtime.   Yes [provider]  ketorolac (TORADOL) 10 MG tablet Take 1 tablet (10 mg total) by mouth every 6 (six) hours as needed for moderate pain. 07/04/17  Yes Carrie Mew, MD  traZODone (DESYREL) 50 MG tablet Take 75 mg by mouth at bedtime.   Yes [provider]    Allergies as of 05/12/2019  . (No Known Allergies)    History reviewed. No pertinent family history.  Social History   Socioeconomic History  . Marital status: Married    Spouse name: Not on file  . Number of children: Not on file  . Years of education: Not on file  . Highest education level: Not on file  Occupational History  . Not on file  Social Needs  . Financial resource strain: Not on file  . Food insecurity    Worry: Not on file    Inability: Not on file  . Transportation needs    Medical: Not on file    Non-medical: Not on file  Tobacco Use  . Smoking status: Current Every Day Smoker    Packs/day: 1.00    Types: Cigarettes  . Smokeless tobacco: Never Used  Substance and Sexual Activity  . Alcohol use: No  . Drug use: Never  . Sexual activity: Not on file  Lifestyle  . Physical activity    Days per week: Not on file    Minutes per session: Not on file  . Stress: Not on file   Relationships  . Social Herbalist on phone: Not on file    Gets together: Not on file    Attends religious service: Not on file    Active member of club or organization: Not on file    Attends meetings of clubs or organizations: Not on file    Relationship status: Not on file  . Intimate partner violence    Fear of current or ex partner: Not on file    Emotionally abused: Not on file    Physically abused: Not on file    Forced sexual activity: Not on file  Other Topics Concern  . Not on file  Social History Narrative  . Not on file    Review of Systems: See HPI, otherwise negative ROS  Physical Exam: BP (!) 131/92   Pulse 86   Temp (!) 97.3 F (36.3 C) (Temporal)   Resp 16   Ht 5\' 7"  (1.702 m)   Wt 85.3 kg   SpO2 98%   BMI 29.44 kg/m  General:   Alert,  pleasant and cooperative in NAD Head:  Normocephalic and atraumatic. Neck:  Supple; no masses or thyromegaly. Lungs:  Clear throughout to auscultation.    Heart:  Regular rate and rhythm. Abdomen:  Soft, nontender and nondistended. Normal bowel sounds, without guarding, and without rebound.   Neurologic:  Alert and  oriented x4;  grossly normal neurologically.  Impression/Plan: Eric Garza is here for an colonoscopy to be performed for colon cancer screening  Risks, benefits, limitations, and alternatives regarding  colonoscopy have been reviewed with the patient.  Questions have been answered.  All parties agreeable.   Sherri Sear, MD  06/27/2019, 8:07 AM

## 2019-06-27 NOTE — Op Note (Signed)
[facility] Patient Name: Eric Garza Procedure Date: 06/27/2019 7:26 AM MRN: [id number] Account #: [id number] Date of Birth: 1966/06/12 Admit Type: Outpatient Age: 53 Room: [facility] ENDO ROOM 1 Gender: Male Note Status: Finalized Procedure:             Colonoscopy Indications:           Screening for colorectal malignant neoplasm, This is                         the patient's first colonoscopy Providers:             Lin Landsman MD, MD Medicines:             Monitored Anesthesia Care Complications:         No immediate complications. Estimated blood loss: None. Procedure:             Pre-Anesthesia Assessment:                        - Prior to the procedure, a History and Physical was                         performed, and patient medications and allergies were                         reviewed. The patient is competent. The risks and                         benefits of the procedure and the sedation options and                         risks were discussed with the patient. All questions                         were answered and informed consent was obtained.                         Patient identification and proposed procedure were                         verified by the physician, the nurse, the                         anesthesiologist, the anesthetist and the technician                         in the pre-procedure area in the procedure room in the                         endoscopy suite. Mental Status Examination: alert and                         oriented. Airway Examination: normal oropharyngeal                         airway and neck mobility. Respiratory Examination:                         clear to auscultation. CV Examination: normal.  Prophylactic Antibiotics: The patient does not require                         prophylactic antibiotics. Prior Anticoagulants: The                         patient has taken no  previous anticoagulant or                         antiplatelet agents. ASA Grade Assessment: II - A                         patient with mild systemic disease. After reviewing                         the risks and benefits, the patient was deemed in                         satisfactory condition to undergo the procedure. The                         anesthesia plan was to use monitored anesthesia care                         (MAC). Immediately prior to administration of                         medications, the patient was re-assessed for adequacy                         to receive sedatives. The heart rate, respiratory                         rate, oxygen saturations, blood pressure, adequacy of                         pulmonary ventilation, and response to care were                         monitored throughout the procedure. The physical                         status of the patient was re-assessed after the                         procedure.                        After obtaining informed consent, the colonoscope was                         passed under direct vision. Throughout the procedure,                         the patient's blood pressure, pulse, and oxygen                         saturations were monitored continuously. The  Colonoscope was introduced through the anus and                         advanced to the the cecum, identified by appendiceal                         orifice and ileocecal valve. The colonoscopy was                         performed without difficulty. The patient tolerated                         the procedure well. The quality of the bowel                         preparation was evaluated using the BBPS [facility] Bowel                         Preparation Scale) with scores of: Right Colon = 3,                         Transverse Colon = 3 and Left Colon = 3 (entire mucosa                         seen well with no residual staining, small  fragments                         of stool or opaque liquid). The total BBPS score                         equals 9. Findings:      The perianal and digital rectal examinations were normal. Pertinent       negatives include normal sphincter tone and no palpable rectal lesions.      A 6 mm polyp was found in the rectum. The polyp was sessile. The polyp       was removed with a cold snare. Resection and retrieval were complete.      Copious quantities of liquid stool was found in the entire colon,       precluding visualization. Lavage of the area was performed using 200 -       500 mL of sterile water, resulting in clearance with adequate       visualization.      The retroflexed view of the distal rectum and anal verge was normal and       showed no anal or rectal abnormalities. Impression:            - One 6 mm polyp in the rectum, removed with a cold                         snare. Resected and retrieved.                        - Stool in the entire examined colon.                        - The distal rectum and anal verge are normal on  retroflexion view. Recommendation:        - Discharge patient to home (with escort).                        - Resume previous diet today.                        - Continue present medications.                        - Await pathology results.                        - Repeat colonoscopy in 7 years for surveillance. Procedure Code(s):     --- Professional ---                        [phone], Colonoscopy, flexible; with removal of                         tumor(s), polyp(s), or other lesion(s) by snare                         technique Diagnosis Code(s):     --- Professional ---                        Z12.11, Encounter for screening for malignant neoplasm                         of colon                        K62.1, Rectal polyp CPT copyright 2019 [employer]. All rights reserved. The codes documented in this report  are preliminary and upon coder review may  be revised to meet current compliance requirements. Dr. Ulyess Mort Lin Landsman MD, MD 06/27/2019 8:32:07 AM This report has been signed electronically. Number of Addenda: 0 Note Initiated On: 06/27/2019 7:26 AM Scope Withdrawal Time: 0 hours 10 minutes 2 seconds  Total Procedure Duration: 0 hours 13 minutes 59 seconds  Estimated Blood Loss:  Estimated blood loss: none.      [facility]

## 2019-06-27 NOTE — Transfer of Care (Signed)
Immediate Anesthesia Transfer of Care Note  Patient: Eric Garza  Procedure(s) Performed: COLONOSCOPY WITH PROPOFOL (N/A )  Patient Location: PACU and Endoscopy Unit  Anesthesia Type:General  Level of Consciousness: sedated  Airway & Oxygen Therapy: Patient Spontanous Breathing and Patient connected to nasal cannula oxygen  Post-op Assessment: Report given to RN and Post -op Vital signs reviewed and stable  Post vital signs: Reviewed and stable  Last Vitals:  Vitals Value Taken Time  BP 92/68 06/27/19 0834  Temp 36.1 C 06/27/19 0834  Pulse    Resp 18 06/27/19 0834  SpO2 99 % 06/27/19 0834    Last Pain:  Vitals:   06/27/19 0834  TempSrc: Tympanic  PainSc: 0-No pain         Complications: No apparent anesthesia complications

## 2019-06-27 NOTE — Anesthesia Post-op Follow-up Note (Signed)
Anesthesia QCDR form completed.        

## 2019-06-30 ENCOUNTER — Encounter: Payer: Self-pay | Admitting: Gastroenterology

## 2019-06-30 LAB — SURGICAL PATHOLOGY

## 2022-05-16 ENCOUNTER — Other Ambulatory Visit: Payer: Self-pay | Admitting: Internal Medicine

## 2022-05-16 DIAGNOSIS — M539 Dorsopathy, unspecified: Secondary | ICD-10-CM

## 2022-05-26 ENCOUNTER — Emergency Department: Payer: No Typology Code available for payment source

## 2022-05-26 ENCOUNTER — Observation Stay
Admission: EM | Admit: 2022-05-26 | Discharge: 2022-05-27 | Disposition: A | Discharge status: Home / Self Care | Payer: No Typology Code available for payment source | Attending: Internal Medicine | Admitting: Internal Medicine

## 2022-05-26 ENCOUNTER — Other Ambulatory Visit: Payer: Self-pay

## 2022-05-26 ENCOUNTER — Encounter: Payer: Self-pay | Admitting: Internal Medicine

## 2022-05-26 DIAGNOSIS — R531 Weakness: Secondary | ICD-10-CM | POA: Diagnosis present

## 2022-05-26 DIAGNOSIS — Z720 Tobacco use: Secondary | ICD-10-CM | POA: Diagnosis not present

## 2022-05-26 DIAGNOSIS — Z79899 Other long term (current) drug therapy: Secondary | ICD-10-CM

## 2022-05-26 DIAGNOSIS — G9341 Metabolic encephalopathy: Principal | ICD-10-CM | POA: Diagnosis present

## 2022-05-26 DIAGNOSIS — E785 Hyperlipidemia, unspecified: Secondary | ICD-10-CM | POA: Diagnosis not present

## 2022-05-26 DIAGNOSIS — F17210 Nicotine dependence, cigarettes, uncomplicated: Secondary | ICD-10-CM | POA: Insufficient documentation

## 2022-05-26 DIAGNOSIS — J690 Pneumonitis due to inhalation of food and vomit: Secondary | ICD-10-CM | POA: Diagnosis present

## 2022-05-26 DIAGNOSIS — J189 Pneumonia, unspecified organism: Secondary | ICD-10-CM

## 2022-05-26 DIAGNOSIS — Z1152 Encounter for screening for COVID-19: Secondary | ICD-10-CM | POA: Diagnosis not present

## 2022-05-26 DIAGNOSIS — F32A Depression, unspecified: Secondary | ICD-10-CM | POA: Diagnosis present

## 2022-05-26 HISTORY — DX: Tobacco use: Z72.0

## 2022-05-26 HISTORY — DX: Depression, unspecified: F32.A

## 2022-05-26 HISTORY — DX: Hyperlipidemia, unspecified: E78.5

## 2022-05-26 LAB — DIFFERENTIAL
Abs Immature Granulocytes: 0.03 10*3/uL (ref 0.00–0.07)
Basophils Absolute: 0 10*3/uL (ref 0.0–0.1)
Basophils Relative: 0 %
Eosinophils Absolute: 0.3 10*3/uL (ref 0.0–0.5)
Eosinophils Relative: 3 %
Immature Granulocytes: 0 %
Lymphocytes Relative: 24 %
Lymphs Abs: 2.2 10*3/uL (ref 0.7–4.0)
Monocytes Absolute: 0.5 10*3/uL (ref 0.1–1.0)
Monocytes Relative: 6 %
Neutro Abs: 6.3 10*3/uL (ref 1.7–7.7)
Neutrophils Relative %: 67 %

## 2022-05-26 LAB — HIV ANTIBODY (ROUTINE TESTING W REFLEX): HIV Screen 4th Generation wRfx: NONREACTIVE

## 2022-05-26 LAB — COMPREHENSIVE METABOLIC PANEL
ALT: 18 U/L (ref 0–44)
AST: 25 U/L (ref 15–41)
Albumin: 3.9 g/dL (ref 3.5–5.0)
Alkaline Phosphatase: 83 U/L (ref 38–126)
Anion gap: 8 (ref 5–15)
BUN: 17 mg/dL (ref 6–20)
CO2: 24 mmol/L (ref 22–32)
Calcium: 9.3 mg/dL (ref 8.9–10.3)
Chloride: 110 mmol/L (ref 98–111)
Creatinine, Ser: 1 mg/dL (ref 0.61–1.24)
GFR, Estimated: >60 mL/min (ref >60)
Glucose, Bld: 101 mg/dL — ABNORMAL HIGH (ref 70–99)
Potassium: 3.8 mmol/L (ref 3.5–5.1)
Sodium: 142 mmol/L (ref 135–145)
Total Bilirubin: 0.6 mg/dL (ref 0.3–1.2)
Total Protein: 7 g/dL (ref 6.5–8.1)

## 2022-05-26 LAB — CBC
HCT: 45.5 % (ref 39.0–52.0)
Hemoglobin: 15.5 g/dL (ref 13.0–17.0)
MCH: 31.4 pg (ref 26.0–34.0)
MCHC: 34.1 g/dL (ref 30.0–36.0)
MCV: 92.3 fL (ref 80.0–100.0)
Platelets: 222 10*3/uL (ref 150–400)
RBC: 4.93 MIL/uL (ref 4.22–5.81)
RDW: 12.2 % (ref 11.5–15.5)
WBC: 9.4 10*3/uL (ref 4.0–10.5)
nRBC: 0 % (ref 0.0–0.2)

## 2022-05-26 LAB — URINE DRUG SCREEN, QUALITATIVE (ARMC ONLY)
Amphetamines, Ur Screen: NOT DETECTED
Barbiturates, Ur Screen: NOT DETECTED
Benzodiazepine, Ur Scrn: POSITIVE — AB
Cannabinoid 50 Ng, Ur ~~LOC~~: NOT DETECTED
Cocaine Metabolite,Ur ~~LOC~~: NOT DETECTED
MDMA (Ecstasy)Ur Screen: NOT DETECTED
Methadone Scn, Ur: NOT DETECTED
Opiate, Ur Screen: NOT DETECTED
Phencyclidine (PCP) Ur S: NOT DETECTED
Tricyclic, Ur Screen: POSITIVE — AB

## 2022-05-26 LAB — RESP PANEL BY RT-PCR (FLU A&B, COVID) ARPGX2
Influenza A by PCR: NEGATIVE
Influenza B by PCR: NEGATIVE
SARS Coronavirus 2 by RT PCR: NEGATIVE

## 2022-05-26 LAB — STREP PNEUMONIAE URINARY ANTIGEN: Strep Pneumo Urinary Antigen: NEGATIVE

## 2022-05-26 LAB — PROTIME-INR
INR: 1 (ref 0.8–1.2)
Prothrombin Time: 13.1 seconds (ref 11.4–15.2)

## 2022-05-26 LAB — URINALYSIS, ROUTINE W REFLEX MICROSCOPIC
Bilirubin Urine: NEGATIVE
Glucose, UA: NEGATIVE mg/dL
Hgb urine dipstick: NEGATIVE
Ketones, ur: NEGATIVE mg/dL
Leukocytes,Ua: NEGATIVE
Nitrite: NEGATIVE
Protein, ur: NEGATIVE mg/dL
Specific Gravity, Urine: 1.025 (ref 1.005–1.030)
pH: 7 (ref 5.0–8.0)

## 2022-05-26 LAB — APTT: aPTT: 33 seconds (ref 24–36)

## 2022-05-26 LAB — ETHANOL: Alcohol, Ethyl (B): <10 mg/dL (ref <10)

## 2022-05-26 LAB — TROPONIN I (HIGH SENSITIVITY)
Troponin I (High Sensitivity): 5 ng/L (ref <18)
Troponin I (High Sensitivity): 4 ng/L (ref <18)

## 2022-05-26 LAB — CBG MONITORING, ED: Glucose-Capillary: 112 mg/dL — ABNORMAL HIGH (ref 70–99)

## 2022-05-26 MED ORDER — GADOBUTROL 1 MMOL/ML IV SOLN
7.5000 mL | Freq: Once | INTRAVENOUS | Status: AC | PRN
  Administered 2022-05-26: 7.5 mL via INTRAVENOUS

## 2022-05-26 MED ORDER — SODIUM CHLORIDE 0.9 % IV SOLN: 500.0000 mg | Freq: Once | INTRAVENOUS | Status: DC

## 2022-05-26 MED ORDER — ALBUTEROL SULFATE (2.5 MG/3ML) 0.083% IN NEBU: 3.0000 mL | INHALATION_SOLUTION | RESPIRATORY_TRACT | Status: DC | PRN

## 2022-05-26 MED ORDER — SODIUM CHLORIDE 0.9 % IV SOLN: 1.0000 g | Freq: Once | INTRAVENOUS | Status: DC

## 2022-05-26 MED ORDER — IOHEXOL 350 MG/ML SOLN
75.0000 mL | Freq: Once | INTRAVENOUS | Status: AC | PRN
  Administered 2022-05-26: 75 mL via INTRAVENOUS

## 2022-05-26 MED ORDER — ATORVASTATIN CALCIUM 20 MG PO TABS
20.0000 mg | ORAL_TABLET | Freq: Every day | ORAL | Status: DC
  Administered 2022-05-26: 20 mg via ORAL
  Filled 2022-05-26: qty 1

## 2022-05-26 MED ORDER — DM-GUAIFENESIN ER 30-600 MG PO TB12: 1.0000 | ORAL_TABLET | Freq: Two times a day (BID) | ORAL | Status: DC | PRN

## 2022-05-26 MED ORDER — ENOXAPARIN SODIUM 40 MG/0.4ML IJ SOSY
40.0000 mg | PREFILLED_SYRINGE | INTRAMUSCULAR | Status: DC
  Administered 2022-05-26: 40 mg via SUBCUTANEOUS
  Filled 2022-05-26: qty 0.4

## 2022-05-26 MED ORDER — NICOTINE 21 MG/24HR TD PT24
21.0000 mg | MEDICATED_PATCH | Freq: Every day | TRANSDERMAL | Status: DC
  Administered 2022-05-26: 21 mg via TRANSDERMAL
  Filled 2022-05-26: qty 1

## 2022-05-26 MED ORDER — ACETAMINOPHEN 325 MG PO TABS
650.0000 mg | ORAL_TABLET | Freq: Four times a day (QID) | ORAL | Status: DC | PRN
  Administered 2022-05-26: 650 mg via ORAL
  Filled 2022-05-26: qty 2

## 2022-05-26 MED ORDER — SODIUM CHLORIDE 0.9 % IV SOLN
3.0000 g | Freq: Four times a day (QID) | INTRAVENOUS | Status: DC
  Administered 2022-05-26 – 2022-05-27 (×3): 3 g via INTRAVENOUS
  Filled 2022-05-26 (×5): qty 8

## 2022-05-26 MED ORDER — ONDANSETRON HCL 4 MG/2ML IJ SOLN: 4.0000 mg | Freq: Three times a day (TID) | INTRAMUSCULAR | Status: DC | PRN

## 2022-05-26 NOTE — H&P (Signed)
History and Physical    Eric Garza SLH:[id number] DOB: 12-Jul-1966 DOA: 05/26/2022  Referring MD/NP/PA:   PCP: Idelle Crouch, MD   Patient coming from:  The patient is coming from home.  At baseline, pt is independent for most of ADL.        Chief Complaint: confusion, right facial droop, difficult speaking, cough  HPI: Eric Garza is a 56 y.o. male with medical history significant of hyperlipidemia, tobacco abuse, depression, who presents with confusion, right facial droop, difficulty speaking, cough.  Per patient's wife, patient has been intermittently confused in the past several days.  3 days ago, patient was noted to have right facial droop, which resolved spontaneously.  Patient has been lethargic, very sleepy. This morning patient was difficult to be waken up.  He seems to have right sided weakness.  He is noted to have difficulty speaking and slurred speech.  When I saw patient in ED, patient is lethargic, still orientated x3.  He moves all extremities.  No facial droop or slurred speech.  Patient has dry cough for more than 3 days, denies chest pain, shortness breath, fever or chills.  Patient has nausea, no vomiting, diarrhea or abdominal pain.  No symptoms of UTI. Of note, patient has a several sedative medication listed, including Seroquel, Zoloft, trazodone, Flexeril.  Per his wife, patient is not taking Flexeril currently.  Data reviewed independently and ED Course: pt was found to have WBC 9.4, INR 1.0, troponin negative x2, UDS positive for TCA and benzo, negative UDS, electrolytes renal function okay, temperature normal, blood pressure 122/93, heart rate 94, 65, RR 20, oxygen saturation 95% on room air.  MRI of brain is negative for stroke.  Chest x-ray showed bilateral basilar infiltration.  Patient is placed on telemetry bed for observation.   EKG: I have personally reviewed. sinus rhythm, QTc 461, LAE, LAD.     Review of Systems:   General: no fevers, chills, no body  weight gain, fatigue HEENT: no blurry vision, hearing changes or sore throat Respiratory: no dyspnea, has coughing, no wheezing CV: no chest pain, no palpitations GI: no nausea, vomiting, abdominal pain, diarrhea, constipation GU: no dysuria, burning on urination, increased urinary frequency, hematuria  Ext: no leg edema Neuro: No vision change or hearing loss.  Has confusion, right facial droop, right-sided weakness, difficulty speaking Skin: no rash, no skin tear. MSK: No muscle spasm, no deformity, no limitation of range of movement in spin Heme: No easy bruising.  Travel history: No recent long distant travel.   Allergy:  Allergies  Allergen Reactions   Oxycodone     Past Medical History:  Diagnosis Date   Depression    HLD (hyperlipidemia)    Pre-diabetes    Tobacco abuse     Past Surgical History:  Procedure Laterality Date   COLONOSCOPY WITH PROPOFOL N/A 06/27/2019   Procedure: COLONOSCOPY WITH PROPOFOL;  Surgeon: Lin Landsman, MD;  Location: [facility] ENDOSCOPY;  Service: Gastroenterology;  Laterality: N/A;    Social History:  reports that he has been smoking cigarettes. He has been smoking an average of 1 pack per day. He has never used smokeless tobacco. He reports that he does not drink alcohol and does not use drugs.  Family History: No family history on file.   Prior to Admission medications   Medication Sig Start Date End Date Taking? Authorizing Provider  sertraline (ZOLOFT) 100 MG tablet Take 100 mg by mouth daily. 02/17/22  Yes [provider]  albuterol (VENTOLIN HFA) 108 (90 Base) MCG/ACT inhaler SMARTSIG:2 Puff(s) By Mouth Every 4 Hours PRN 01/01/22   [provider]  atorvastatin (LIPITOR) 20 MG tablet Take 20 mg by mouth daily. 02/17/22   [provider]  cetirizine (ZYRTEC) 10 MG tablet Take 10 mg by mouth daily.    [provider]  cyclobenzaprine (FLEXERIL) 10 MG tablet Take 10 mg by mouth at bedtime.    [provider]  ketorolac (TORADOL) 10 MG tablet Take 1 tablet (10 mg total) by mouth every 6 (six) hours as needed for moderate pain. Patient not taking: Reported on 05/26/2022 07/04/17   Carrie Mew, MD  QUEtiapine (SEROQUEL) 100 MG tablet Take 100 mg by mouth at bedtime. 05/21/22   [provider]  traZODone (DESYREL) 50 MG tablet Take 75 mg by mouth at bedtime.    [provider]    Physical Exam: Vitals:   05/26/22 1949 05/26/22 2124 05/27/22 0044 05/27/22 0644  BP: 99/73 119/83 100/74 104/70  Pulse: 88 74 69 68  Resp: '20 17 18 16  '$ Temp: (!) 97.5 F (36.4 C) 98 F (36.7 C) 97.8 F (36.6 C) 97.6 F (36.4 C)  TempSrc: Oral Oral    SpO2: 96% 94% 96% 96%  Weight:  86 kg    Height:  '5\' 7"'$  (1.702 m)     General: Not in acute distress HEENT:       Eyes: PERRL, EOMI, no scleral icterus.       ENT: No discharge from the ears and nose, no pharynx injection, no tonsillar enlargement.        Neck: No JVD, no bruit, no mass felt. Heme: No neck lymph node enlargement. Cardiac: S1/S2, RRR, No murmurs, No gallops or rubs. Respiratory: No rales, wheezing, rhonchi or rubs. GI: Soft, nondistended, nontender, no rebound pain, no organomegaly, BS present. GU: No hematuria Ext: No pitting leg edema bilaterally. 1+DP/PT pulse bilaterally. Musculoskeletal: No joint deformities, No joint redness or warmth, no limitation of ROM in spin. Skin: No rashes.  Neuro: Lethargic, mildly confused, oriented X3, cranial nerves II-XII grossly intact, moves all extremities normally.  Psych: Patient is not psychotic, no suicidal or hemocidal ideation.  Labs on Admission: I have personally reviewed following labs and imaging studies  CBC: Recent Labs  Lab 05/26/22 0926 05/27/22 0431  WBC 9.4 8.9  NEUTROABS 6.3  --   HGB 15.5 15.0  HCT 45.5 43.6  MCV 92.3 92.0  PLT 222 034   Basic Metabolic Panel: Recent Labs  Lab 05/26/22 0926 05/27/22 0431  NA 142 142  K 3.8 3.8  CL  110 109  CO2 24 26  GLUCOSE 101* 93  BUN 17 21*  CREATININE 1.00 1.07  CALCIUM 9.3 9.1   GFR: Estimated Creatinine Clearance: 81.8 mL/min (by C-G formula based on SCr of 1.07 mg/dL). Liver Function Tests: Recent Labs  Lab 05/26/22 0926  AST 25  ALT 18  ALKPHOS 83  BILITOT 0.6  PROT 7.0  ALBUMIN 3.9   No results for input(s): "LIPASE", "AMYLASE" in the last 168 hours. No results for input(s): "AMMONIA" in the last 168 hours. Coagulation Profile: Recent Labs  Lab 05/26/22 0926  INR 1.0   Cardiac Enzymes: No results for input(s): "CKTOTAL", "CKMB", "CKMBINDEX", "TROPONINI" in the last 168 hours. BNP (last 3 results) No results for input(s): "PROBNP" in the last 8760 hours. HbA1C: No results for input(s): "HGBA1C" in the last 72 hours. CBG: Recent Labs  Lab 05/26/22 [phone]  GLUCAP 112*   Lipid Profile: No results for input(s): "CHOL", "HDL", "LDLCALC", "TRIG", "CHOLHDL", "LDLDIRECT" in the last 72 hours. Thyroid Function Tests: No results for input(s): "TSH", "T4TOTAL", "FREET4", "T3FREE", "THYROIDAB" in the last 72 hours. Anemia Panel: No results for input(s): "VITAMINB12", "FOLATE", "FERRITIN", "TIBC", "IRON", "RETICCTPCT" in the last 72 hours. Urine analysis:    Component Value Date/Time   COLORURINE YELLOW (A) 05/26/2022 0948   APPEARANCEUR CLEAR (A) 05/26/2022 0948   LABSPEC 1.025 05/26/2022 0948   PHURINE 7.0 05/26/2022 0948   GLUCOSEU NEGATIVE 05/26/2022 0948   HGBUR NEGATIVE 05/26/2022 0948   BILIRUBINUR NEGATIVE 05/26/2022 0948   KETONESUR NEGATIVE 05/26/2022 0948   PROTEINUR NEGATIVE 05/26/2022 0948   NITRITE NEGATIVE 05/26/2022 0948   LEUKOCYTESUR NEGATIVE 05/26/2022 0948   Sepsis Labs: '@LABRCNTIP'$ (procalcitonin:4,lacticidven:4) ) Recent Results (from the past 240 hour(s))  Resp Panel by RT-PCR (Flu A&B, Covid) Anterior Nasal Swab     Status: None   Collection Time: 05/26/22 10:13 AM   Specimen: Anterior Nasal Swab  Result Value Ref Range Status    SARS Coronavirus 2 by RT PCR NEGATIVE NEGATIVE Final    Comment: (NOTE) SARS-CoV-2 target nucleic acids are NOT DETECTED.  The SARS-CoV-2 RNA is generally detectable in upper respiratory specimens during the acute phase of infection. The lowest concentration of SARS-CoV-2 viral copies this assay can detect is 138 copies/mL. A negative result does not preclude SARS-Cov-2 infection and should not be used as the sole basis for treatment or other patient management decisions. A negative result may occur with  improper specimen collection/handling, submission of specimen other than nasopharyngeal swab, presence of viral mutation(s) within the areas targeted by this assay, and inadequate number of viral copies(<138 copies/mL). A negative result must be combined with clinical observations, patient history, and epidemiological information. The expected result is Negative.  Fact Sheet for Patients:  EntrepreneurPulse.com.au  Fact Sheet for Healthcare Providers:  IncredibleEmployment.be  This test is no t yet approved or cleared by the [place] FDA and  has been authorized for detection and/or diagnosis of SARS-CoV-2 by FDA under an Emergency Use Authorization (EUA). This EUA will remain  in effect (meaning this test can be used) for the duration of the COVID-19 declaration under Section 564(b)(1) of the Act, 21 U.S.C.section 360bbb-3(b)(1), unless the authorization is terminated  or revoked sooner.       Influenza A by PCR NEGATIVE NEGATIVE Final   Influenza B by PCR NEGATIVE NEGATIVE Final    Comment: (NOTE) The Xpert Xpress SARS-CoV-2/FLU/RSV plus assay is intended as an aid in the diagnosis of influenza from Nasopharyngeal swab specimens and should not be used as a sole basis for treatment. Nasal washings and aspirates are unacceptable for Xpert Xpress SARS-CoV-2/FLU/RSV testing.  Fact Sheet for  Patients: EntrepreneurPulse.com.au  Fact Sheet for Healthcare Providers: IncredibleEmployment.be  This test is not yet approved or cleared by the [place] FDA and has been authorized for detection and/or diagnosis of SARS-CoV-2 by FDA under an Emergency Use Authorization (EUA). This EUA will remain in effect (meaning this test can be used) for the duration of the COVID-19 declaration under Section 564(b)(1) of the Act, 21 U.S.C. section 360bbb-3(b)(1), unless the authorization is terminated or revoked.  Performed at [facility], [address] [place], [place] [address]      Radiological Exams on Admission: MR Brain W and Wo Contrast  Result Date: 05/26/2022 CLINICAL DATA:  Stroke code EXAM: MRI HEAD WITHOUT AND WITH CONTRAST TECHNIQUE: Multiplanar, multiecho pulse sequences  of the brain and surrounding structures were obtained without and with intravenous contrast. CONTRAST:  7.32m GADAVIST GADOBUTROL 1 MMOL/ML IV SOLN COMPARISON:  Same-day CT brain/head and neck angiogram FINDINGS: Brain: No acute infarction, hemorrhage, hydrocephalus, extra-axial collection or mass lesion. 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 There are periventricular and subcortical T2/FLAIR hyperintense lesions favored to represent sequela of moderate chronic microvascular ischemic change. There may be mild asymmetric contrast enhancement in the labyrinthine segment of the right facial nerve (series 20, image 45, 43). No other abnormally contrast-enhancing lesion is visualized. Vascular: Normal flow voids. Skull and upper cervical spine: Normal marrow signal. Sinuses/Orbits: Mild mucosal thickening right maxillary and left sphenoid sinus. Other: None. IMPRESSION: 1. No acute intracranial process. 2. Possible mild asymmetric contrast enhancement in the labyrinthine segment of the right facial nerve, which may be artifactual. Correlate with symptoms of right  facial neuritis. 3. Scattered periventricular and subcortical T2/FLAIR hyperintense lesions are favored to represent sequela of chronic microvascular ischemic change. Electronically Signed   By: HMarin RobertsM.D.   On: 05/26/2022 12:57   DG Chest 2 View  Result Date: 05/26/2022 CLINICAL DATA:  Cough.  Right-sided weakness. EXAM: CHEST - 2 VIEW COMPARISON:  07/04/17 CXR FINDINGS: No pleural effusion. No pneumothorax. There is consolidative opacity in the left lung base, and to a lesser degree of the right lung base. Visualized upper abdomen is unremarkable. No displaced rib fractures. Cardiac and mediastinal contours are appearance. IMPRESSION: Bibasilar pulmonary opacities are worrisome for aspiration and/or infection. Electronically Signed   By: HMarin RobertsM.D.   On: 05/26/2022 11:00   CT ANGIO HEAD NECK W WO CM (CODE STROKE)  Result Date: 05/26/2022 CLINICAL DATA:  Stroke, follow-up. EXAM: CT ANGIOGRAPHY HEAD AND NECK TECHNIQUE: Multidetector CT imaging of the head and neck was performed using the standard protocol during bolus administration of intravenous contrast. Multiplanar CT image reconstructions and MIPs were obtained to evaluate the vascular anatomy. Carotid stenosis measurements (when applicable) are obtained utilizing NASCET criteria, using the distal internal carotid diameter as the denominator. RADIATION DOSE REDUCTION: This exam was performed according to the departmental dose-optimization program which includes automated exposure control, adjustment of the mA and/or kV according to patient size and/or use of iterative reconstruction technique. CONTRAST:  717mOMNIPAQUE IOHEXOL 350 MG/ML SOLN COMPARISON:  Head CT May 26, 2022. FINDINGS: CTA NECK FINDINGS Aortic arch: Standard branching. Imaged portion shows no evidence of aneurysm or dissection. Atherosclerotic plaques are seen in the aortic arch and at the origin of the major arch arteries without hemodynamically significant  stenosis. Right carotid system: A web is seen in the right carotid bulb in addition to mild atherosclerotic changes without hemodynamically significant stenosis. Left carotid system: Mild atherosclerotic changes along the left common carotid artery and in the left carotid bifurcation without hemodynamically significant stenosis. Vertebral arteries: Atherosclerotic changes at the proximal right subclavian artery resulting in mild stenosis. The left vertebral artery is dominant. Calcified atherosclerotic changes are noted at the V1 segment of the left vertebral artery without hemodynamically significant stenosis. The right vertebral artery has normal course and caliber. Skeleton: Negative. Other neck: Negative. Upper chest: Negative. Review of the MIP images confirms the above findings CTA HEAD FINDINGS Anterior circulation: No significant stenosis, proximal occlusion, aneurysm, or vascular malformation. Posterior circulation: No significant stenosis, proximal occlusion, aneurysm, or vascular malformation. Venous sinuses: As permitted by contrast timing, patent. Anatomic variants: Right fetal PCA. Review of the MIP images confirms the above findings IMPRESSION: 1. No intracranial large  vessel occlusion or hemodynamically significant stenosis. 2. Mild atherosclerotic changes of the bilateral carotid bifurcation without hemodynamically significant stenosis. 3. Right carotid web. 4. Aortic atherosclerosis. Aortic Atherosclerosis (ICD10-I70.0). Electronically Signed   By: Pedro Earls M.D.   On: 05/26/2022 10:32   CT HEAD CODE STROKE WO CONTRAST  Result Date: 05/26/2022 CLINICAL DATA:  Code stroke.  Neuro deficit, acute, stroke suspected EXAM: CT HEAD WITHOUT CONTRAST TECHNIQUE: Contiguous axial images were obtained from the base of the skull through the vertex without intravenous contrast. RADIATION DOSE REDUCTION: This exam was performed according to the departmental dose-optimization program which  includes automated exposure control, adjustment of the mA and/or kV according to patient size and/or use of iterative reconstruction technique. COMPARISON:  CT head 03/30/2017. FINDINGS: Brain: No evidence of acute large vascular territory infarction, hemorrhage, hydrocephalus, extra-axial collection or mass lesion/mass effect. Patchy white matter hypodensities, nonspecific but compatible with chronic microvascular ischemic disease. Vascular: No hyperdense vessel identified. Skull: No acute fracture. Sinuses/Orbits: Clear sinuses.  No acute orbital findings. Other: No mastoid effusions. ASPECTS [facility] Stroke Program Early CT Score) Total score (0-10 with 10 being normal): 10. IMPRESSION: 1. No evidence of acute intracranial abnormality. 2. ASPECTS is 10. Code stroke imaging results were communicated on 05/26/2022 at 9:58 am to provider Dr. Rory Percy via secure text paging. Electronically Signed   By: Margaretha Sheffield M.D.   On: 05/26/2022 09:59      Assessment/Plan Principal Problem:   Acute metabolic encephalopathy Active Problems:   HLD (hyperlipidemia)   Depression   Tobacco abuse   Aspiration pneumonia (HCC)   Polypharmacy_possible polypharmacy   Assessment and Plan: * Acute metabolic encephalopathy Patient has altered mental status, right facial droop, right-sided weakness, difficulty speaking.  Etiology is not clear.  Urinalysis negative for UTI.  MRI of the brain is negative for stroke.  Patient does not have focal neurodeficit on physical examination.  Dr. Rory Percy of neurology is consulted, who has low suspicion for meningitis or CNS infection.  Potential differential diagnosis include polypharmacy and benzo abuse.  Patient is on multiple sedative medications, including Seroquel, Zoloft, trazodone, Flexeril (patient is not taking Flexeril per his wife currently).  UDS is positive for benzo, but no benzos on his medication list.  -Placed on telemetry bed for observation -Frequent  neurochecks -Hold Seroquel, Zoloft, trazodone, Flexeril -Treat possible aspiration pneumonia as below     Polypharmacy_possible polypharmacy - Hold sedative medications as above  Aspiration pneumonia (HCC) Possible aspiration pneumonia: Chest x-ray showed bilateral basilar infiltration.  Given his altered mental status, patient likely has aspiration pneumonia. -Started Unasyn -Sputum culture -As needed albuterol and Mucinex  Tobacco abuse - Nicotine patch  Depression - Hold Seroquel, Zoloft and trazodone as above  HLD (hyperlipidemia) - Lipitor          DVT ppx:  SQ Lovenox  Code Status: Full code  Family Communication:  Yes, patient's wife and mother   at bed side.       Disposition Plan:  Anticipate discharge back to previous environment  Consults called:  Dr. Rory Percy of neuro  Admission status and Level of care: Telemetry Medical:  for obs      Dispo: The patient is from: Home              Anticipated d/c is to: Home              Anticipated d/c date is: 1 day  Patient currently is not medically stable to d/c.    Severity of Illness:  The appropriate patient status for this patient is OBSERVATION. Observation status is judged to be reasonable and necessary in order to provide the required intensity of service to ensure the patient's safety. The patient's presenting symptoms, physical exam findings, and initial radiographic and laboratory data in the context of their medical condition is felt to place them at decreased risk for further clinical deterioration. Furthermore, it is anticipated that the patient will be medically stable for discharge from the hospital within 2 midnights of admission.        Date of Service 05/27/2022    [facility]   If 7PM-7AM, please contact night-coverage [email or web address] 05/27/2022, 7:10 AM

## 2022-05-26 NOTE — Assessment & Plan Note (Signed)
Patient has altered mental status, right facial droop, right-sided weakness, difficulty speaking.  Etiology is not clear.  Urinalysis negative for UTI.  MRI of the brain is negative for stroke.  Patient does not have focal neurodeficit on physical examination.  Dr. Rory Percy of neurology is consulted, who has ow suspicion for meningitis or CNS infection.  Potential differential diagnosis include polypharmacy and benzo abuse.  Patient is on multiple sedative medications, including Seroquel, Zoloft, trazodone, Flexeril (patient is not taking Flexeril per his wife currently).  UDS is positive for benzo, but no benzos on his medication list.  -Placed on telemetry bed for observation -Frequent neurochecks -Hold Seroquel, Zoloft, trazodone, Flexeril -Treat possible aspiration pneumonia as below

## 2022-05-26 NOTE — Assessment & Plan Note (Signed)
Possible aspiration pneumonia: Chest x-ray showed bilateral basilar infiltration.  Given his altered mental status, patient likely has aspiration pneumonia. -Started Unasyn -Sputum culture -As needed albuterol and Mucinex

## 2022-05-26 NOTE — Progress Notes (Signed)
   05/26/22 1038  Clinical Encounter Type  Visited With Patient not available  Visit Type Code  Referral From Physician  Consult/Referral To Chaplain   Chaplain responded to Code Stroke. Patient at [place]. No family present at time.

## 2022-05-26 NOTE — ED Provider Notes (Signed)
[facility] Provider Note    Event Date/Time   First MD Initiated Contact with Patient 05/26/22 1005     (approximate)   History   Weakness (C/o right sided weakness, with intermittent right facial droop. Pt's spouse states he may have had right facial droop 2 days ago, "but he says sometimes his right eye droops". Pt states this AM at 0800, he had right arm and leg tingling, and spouse states his right side has been slow/dragging this AM. Spouse states fatigue x2 days.) and Fatigue   HPI  RIDDIK SENNA is a 56 y.o. male with history of back pain, anxiety, depression, PTSD, hyperlipidemia who presents with generalized as well as right arm weakness that the symptoms started with generalized weakness for approximately the 5 days, gradually worsening with more fatigue that has been somewhat intermittent but now more constant.  He has had intermittent right sided facial droop or weakness and has been tired and not acting like himself.  He has been sleeping longer than normal.  The family also reports intermittent tactile fevers.  Since this morning he has had some right arm weakness.  The patient reports a headache different than normal migraines and has associated photophobia but not vomiting.  I reviewed the past medical records.  The patient follows with Dr. Doy Hutching and was last seen on 9/8.  He is on naproxen and diazepam for the back pain as well as trazodone and Seroquel.   Physical Exam   Triage Vital Signs: ED Triage Vitals  Enc Vitals Group     BP 05/26/22 0925 (!) 110/91     Pulse Rate 05/26/22 0925 94     Resp 05/26/22 0925 20     Temp 05/26/22 0925 97.7 F (36.5 C)     Temp Source 05/26/22 0925 Oral     SpO2 05/26/22 0925 99 %     Weight 05/26/22 0925 194 lb (88 kg)     Height 05/26/22 0925 '5\' 7"'$  (1.702 m)     Head Circumference --      Peak Flow --      Pain Score 05/26/22 1004 0     Pain Loc --      Pain Edu? --      Excl. in [place]? --     Most  recent vital signs: Vitals:   05/26/22 0925 05/26/22 1129  BP: (!) 110/91 (!) 122/93  Pulse: 94 65  Resp: 20 20  Temp: 97.7 F (36.5 C) 98.4 F (36.9 C)  SpO2: 99% 95%     General: Sleepy but arousable, oriented x4. CV:  Good peripheral perfusion.  Resp:  Normal effort.  Abd:  No distention.  Soft and nontender. Other:  Mild drift of right upper extremity, otherwise normal motor and sensory in all extremities.  No facial droop at this time.  EOMI.  PERRLA with some photophobia.  No ataxia.  Neck supple, negative Kernig and presents acute signs.   ED Results / Procedures / Treatments   Labs (all labs ordered are listed, but only abnormal results are displayed) Labs Reviewed  COMPREHENSIVE METABOLIC PANEL - Abnormal; Notable for the following components:      Result Value   Glucose, Bld 101 (*)    All other components within normal limits  URINE DRUG SCREEN, QUALITATIVE ([facility] ONLY) - Abnormal; Notable for the following components:   Tricyclic, Ur Screen POSITIVE (*)    Benzodiazepine, Ur Scrn POSITIVE (*)    All other  components within normal limits  URINALYSIS, ROUTINE W REFLEX MICROSCOPIC - Abnormal; Notable for the following components:   Color, Urine YELLOW (*)    APPearance CLEAR (*)    All other components within normal limits  CBG MONITORING, ED - Abnormal; Notable for the following components:   Glucose-Capillary 112 (*)    All other components within normal limits  RESP PANEL BY RT-PCR (FLU A&B, COVID) ARPGX2  EXPECTORATED SPUTUM ASSESSMENT W GRAM STAIN, RFLX TO RESP C  CULTURE, BLOOD (ROUTINE X 2)  CULTURE, BLOOD (ROUTINE X 2)  ETHANOL  PROTIME-INR  APTT  CBC  DIFFERENTIAL  HIV ANTIBODY (ROUTINE TESTING W REFLEX)  STREP PNEUMONIAE URINARY ANTIGEN  TROPONIN I (HIGH SENSITIVITY)  TROPONIN I (HIGH SENSITIVITY)     EKG  ED ECG REPORT I, Arta Silence, the attending physician, personally viewed and interpreted this ECG.  Date: 05/26/2022 EKG Time:  0938 Rate: 102 Rhythm: Sinus tachycardia QRS Axis: normal Intervals: normal ST/T Wave abnormalities: normal Narrative Interpretation: no evidence of acute ischemia    RADIOLOGY  CT head: I independently viewed and interpreted the images; there is no evidence of ICH.  Radiology report indicates no acute findings.  CT angio head: No LVO  PROCEDURES:  Critical Care performed: No  Procedures   MEDICATIONS ORDERED IN ED: Medications  enoxaparin (LOVENOX) injection 40 mg (has no administration in time range)  iohexol (OMNIPAQUE) 350 MG/ML injection 75 mL (75 mLs Intravenous Contrast Given 05/26/22 1002)  gadobutrol (GADAVIST) 1 MMOL/ML injection 7.5 mL (7.5 mLs Intravenous Contrast Given 05/26/22 1229)     IMPRESSION / MDM / ASSESSMENT AND PLAN / ED COURSE  I reviewed the triage vital signs and the nursing notes.  56 year old male with PMH as noted above presents with generalized weakness as well as some intermittent focal neurologic symptoms including right upper extremity weakness this morning.  Due to the acute right upper extremity weakness since this morning I activated code stroke and consulted Dr. Rory Percy from neurology who evaluated the patient in the ED.  In show CT and CTA are negative.  Differential diagnosis includes, but is not limited to, acute or subacute CVA, metabolic disturbance, UTI or other infection, less likely meningitis or encephalitis given the lack of fever or meningeal signs on exam.  However there is some concern given the presence of headache and the subacute nature of the symptoms.  Dr. Malen Gauze recommends MRI brain with and without contrast, lab work-up, and reassessment.  Patient's presentation is most consistent with acute presentation with potential threat to life or bodily function.  The patient is on the cardiac monitor to evaluate for evidence of arrhythmia and/or significant heart rate  changes.  ----------------------------------------- 2:49 PM on 05/26/2022 -----------------------------------------  MRI shows no acute findings including no enhancement of the meninges.  Lab work-up is overall unremarkable with no leukocytosis and normal electrolytes.  UDS is positive for benzodiazepines and tricyclics.  Cardiac enzymes are negative.  COVID is negative.  However, the chest x-ray does show bibasilar opacities concerning for possible pneumonia.  I have ordered empiric antibiotics.  On reassessment the patient is much more comfortable appearing and fully alert.  He would like a cigarette and coffee.  I discussed case further with Dr. Rory Percy.  He advises that there is no evidence of acute stroke and also no evidence of meningitis or encephalitis.  There is no indication for LP at this time.  I consulted Dr. Blaine Hamper from the hospitalist service; based on her discussion he agrees to  admit the patient.   FINAL CLINICAL IMPRESSION(S) / ED DIAGNOSES   Final diagnoses:  Generalized weakness  Community acquired pneumonia, unspecified laterality     Rx / DC Orders   ED Discharge Orders     None        Note:  This document was prepared using Dragon voice recognition software and may include unintentional dictation errors.    Arta Silence, MD 05/26/22 1451

## 2022-05-26 NOTE — Consult Note (Signed)
Neurology Consultation  Reason for Consult: Code stroke for right-sided numbness and weakness Referring Physician: Dr. Abel Presto  CC: Right-sided numbness weakness  History is obtained from: Patient, chart, wife  HPI: Eric Garza is a 56 y.o. male past medical history of back pain, anxiety, depression, hyperlipidemia, allergies, presenting to the emergency room for evaluation of right arm weakness and numbness. His wife reports that he has been feeling unwell for the past 5 to 7 days where she had noted that he is more fatigued, he has been having some fevers for the past 2 days and has had off-and-on right-sided facial weakness that she could see when he is more tired.  She also noticed that he has not been acting like himself.  He is usually not somebody who goes to bed very early but has been sleeping at 7 PM and sleeping for longer than he usually does.  He also has been febrile-she could not tell me the exact numbers but said that she did he felt really hot to touch over the past few days.  This morning, he started complaining of some right-sided weakness and numbness.  His right arm is weaker than the left. He also has headache, that is mild, not like his migraine, but is associated with severe photophobia-which feels so bad that he is wearing sunglasses in the hallway. He has somewhat drowsy, opens eyes to voice, follows commands and provide some history but keeps falling asleep during the time of the encounter. A code stroke was activated due to sudden onset of the arm symptoms which presumably were reported to be new since 8 AM this morning. On my evaluation, he does have some right hemiparesis but the symptoms in general have been going on now for few days which precludes the use of IV thrombolysis.   LKW: Multiple days ago IV thrombolysis given?: no, out the window Premorbid modified Rankin scale (mRS):0   ROS: Performed negative except as noted in HPI Past Medical History:   Diagnosis Date   Pre-diabetes      No family history on file.   Social History:   reports that he has been smoking cigarettes. He has been smoking an average of 1 pack per day. He has never used smokeless tobacco. He reports that he does not drink alcohol and does not use drugs.  Medications No current facility-administered medications for this encounter.  Current Outpatient Medications:    cetirizine (ZYRTEC) 10 MG tablet, Take 10 mg by mouth daily., Disp: , Rfl:    cyclobenzaprine (FLEXERIL) 10 MG tablet, Take 10 mg by mouth at bedtime., Disp: , Rfl:    ketorolac (TORADOL) 10 MG tablet, Take 1 tablet (10 mg total) by mouth every 6 (six) hours as needed for moderate pain., Disp: 12 tablet, Rfl: 0   traZODone (DESYREL) 50 MG tablet, Take 75 mg by mouth at bedtime., Disp: , Rfl:   Exam: Current vital signs: BP (!) 110/91 (BP Location: Right Arm)   Pulse 94   Temp 97.7 F (36.5 C) (Oral)   Resp 20   Ht '5\' 7"'$  (1.702 m)   Wt 88 kg   SpO2 99%   BMI 30.38 kg/m  Vital signs in last 24 hours: Temp:  [97.7 F (36.5 C)] 97.7 F (36.5 C) (10/20 0925) Pulse Rate:  [94] 94 (10/20 0925) Resp:  [20] 20 (10/20 0925) BP: (110)/(91) 110/91 (10/20 0925) SpO2:  [99 %] 99 % (10/20 0925) Weight:  [88 kg] 88 kg (10/20 0925)  General: Somewhat drowsy, in no distress HEENT: Normocephalic atraumatic, no meningitic signs Cardiovascular: Regular rhythm Abdomen nondistended nontender Chest clear Extremities warm well perfused Neurological exam He is somewhat drowsy, no distress Follows commands Poor attention concentration Mildly dysarthric speech-also has multiple missing teeth. No evidence of aphasia Cranial nerves II to XII intact on my examination-no facial droop noted Motor examination with right upper extremity drift-rest of the extremity is normal and antigravity Sensation diminished on the right upper extremity comparison to the left Coordination difficult to assess given his  mentation NIHSS 1a Level of Conscious.: 1 1b LOC Questions: 0 1c LOC Commands: 0 2 Best Gaze: 0 3 Visual: 0 4 Facial Palsy: 0 5a Motor Arm - left: 0 5b Motor Arm - Right: 1 6a Motor Leg - Left: 0 6b Motor Leg - Right: 0 7 Limb Ataxia: 0 8 Sensory: 1 9 Best Language: 0 10 Dysarthria: 1 11 Extinct. and Inatten.: 0 TOTAL: 4   Labs I have reviewed labs in epic and the results pertinent to this consultation are:  CBC    Component Value Date/Time   WBC 9.4 05/26/2022 0926   RBC 4.93 05/26/2022 0926   HGB 15.5 05/26/2022 0926   HGB 15.6 05/26/2014 1228   HCT 45.5 05/26/2022 0926   HCT 46.9 05/26/2014 1228   PLT 222 05/26/2022 0926   PLT 221 05/26/2014 1228   MCV 92.3 05/26/2022 0926   MCV 100 05/26/2014 1228   MCH 31.4 05/26/2022 0926   MCHC 34.1 05/26/2022 0926   RDW 12.2 05/26/2022 0926   RDW 12.8 05/26/2014 1228   LYMPHSABS 2.2 05/26/2022 0926   MONOABS 0.5 05/26/2022 0926   EOSABS 0.3 05/26/2022 0926   BASOSABS 0.0 05/26/2022 0926    CMP     Component Value Date/Time   NA 138 07/04/2017 1910   NA 137 05/26/2014 1228   K 3.6 07/04/2017 1910   K 4.8 05/26/2014 1228   CL 102 07/04/2017 1910   CL 105 05/26/2014 1228   CO2 28 07/04/2017 1910   CO2 26 05/26/2014 1228   GLUCOSE 90 07/04/2017 1910   GLUCOSE 95 05/26/2014 1228   BUN 18 07/04/2017 1910   BUN 11 05/26/2014 1228   CREATININE 0.99 07/04/2017 1910   CREATININE 0.87 05/26/2014 1228   CALCIUM 9.0 07/04/2017 1910   CALCIUM 9.2 05/26/2014 1228   PROT 7.4 03/30/2017 0807   PROT 7.8 05/26/2014 1228   ALBUMIN 4.3 03/30/2017 0807   ALBUMIN 3.9 05/26/2014 1228   AST 20 03/30/2017 0807   AST 31 05/26/2014 1228   ALT 13 (L) 03/30/2017 0807   ALT 32 05/26/2014 1228   ALKPHOS 66 03/30/2017 0807   ALKPHOS 101 05/26/2014 1228   BILITOT 0.6 03/30/2017 0807   BILITOT 0.4 05/26/2014 1228   GFRNONAA >60 07/04/2017 1910   GFRNONAA >60 05/26/2014 1228   GFRAA >60 07/04/2017 1910   GFRAA >60 05/26/2014 1228    Imaging I have reviewed the images obtained:  CT head: Aspects 10.  No bleed. CT angio head and neck: No emergent LVO.  Assessment:  56 year old past history of back pain anxiety depression hyperlipidemia allergies and migraines presenting for initial evaluation of right arm weakness and numbness. Noland Fordyce is that he has been having increased drowsiness, mild headache, and right facial droop for the past 4 days at least. He is also having photophobia with this. The differentials should include a stroke but the symptoms have been going on for multiple days with right facial weakness,  headache for 4 days and the arm weakness being somewhat newer than that-outside the 4-1/2-hour window for IV thrombolysis and no evidence of LVO to consider DVT. Complex migraine remains in the differential given history of migraines. Other differentials should include CNS infection but he has no white count or any meningismus making it less likely. At this time, I would pursue this with further imaging and testing.  Recommendations: Stat MRI brain with and without contrast Check UA, chest x-ray Check toxicology screen Plan discussed with patient, his wife and mother at bedside. Plan also discussed with Dr. Cherylann Banas in the ER.  Addendum MRI brain negative for acute stroke.  No abnormal enhancement.  Low suspicion for meningitis or CNS infection given that he is improving per the ED provider. Chest x-ray with possible pneumonia Will need admission for pneumonia 1. Can symptomatically treat for headache with migraine cocktail. Neurology will follow. Plan discussed with Dr. Cherylann Banas.  -- Amie Portland, MD Neurologist [facility] Pager: [phone]  CRITICAL CARE ATTESTATION Performed by: Amie Portland, MD Total critical care time: 60 minutes Critical care time was exclusive of separately billable procedures and treating other patients and/or supervising APPs/Residents/Students Critical care  was necessary to treat or prevent imminent or life-threatening deterioration due to stroke like symptom This patient is critically ill and at significant risk for neurological worsening and/or death and care requires constant monitoring. Critical care was time spent personally by me on the following activities: development of treatment plan with patient and/or surrogate as well as nursing, discussions with consultants, evaluation of patient's response to treatment, examination of patient, obtaining history from patient or surrogate, ordering and performing treatments and interventions, ordering and review of laboratory studies, ordering and review of radiographic studies, pulse oximetry, re-evaluation of patient's condition, participation in multidisciplinary rounds and medical decision making of high complexity in the care of this patient.

## 2022-05-26 NOTE — Assessment & Plan Note (Signed)
-   Hold Seroquel, Zoloft and trazodone as above

## 2022-05-26 NOTE — ED Notes (Addendum)
Attempted to assist pt to restroom, pt appeared unsteady and told this rn to "get out".  Significant other at bedside.

## 2022-05-26 NOTE — Assessment & Plan Note (Signed)
Lipitor 

## 2022-05-26 NOTE — Assessment & Plan Note (Signed)
-  Nicotine patch 

## 2022-05-26 NOTE — Progress Notes (Signed)
Pharmacy Antibiotic Note  Eric Garza is a 56 y.o. male admitted on 05/26/2022 with aspiration pneumonia.  Pharmacy has been consulted for Unasyn dosing.  -chest x-ray does show bibasilar opacities concerning for possible pneumonia.  Plan: Start Unasyn 3 gm IV q6h   Height: '5\' 7"'$  (170.2 cm) Weight: 88 kg (194 lb) IBW/kg (Calculated) : 66.1  Temp (24hrs), Avg:98.1 F (36.7 C), Min:97.7 F (36.5 C), Max:98.4 F (36.9 C)  Recent Labs  Lab 05/26/22 0926  WBC 9.4  CREATININE 1.00    Estimated Creatinine Clearance: 88.4 mL/min (by C-G formula based on SCr of 1 mg/dL).    Allergies  Allergen Reactions   Oxycodone     Antimicrobials this admission: unasyn 10/20 >>       >>    Dose adjustments this admission:    Microbiology results: 10/20 BCx: pend   UCx:    10/20 Sputum: pend    MRSA PCR:    Thank you for allowing pharmacy to be a part of this patient's care.  Edelyn Heidel A 05/26/2022 3:20 PM

## 2022-05-26 NOTE — Assessment & Plan Note (Signed)
-   Hold sedative medications as above

## 2022-05-26 NOTE — ED Triage Notes (Signed)
C/o right sided weakness, with intermittent right facial droop. Pt's spouse states he may have had right facial droop 2 days ago, "but he says sometimes his right eye droops". Pt states this AM at 0800, he had right arm and leg tingling, and spouse states his right side has been slow/dragging this AM. Spouse states fatigue x2 days

## 2022-05-26 NOTE — ED Notes (Signed)
First Nurse Note: Pt to ED via POV. [place] 2 days ago. Pt wife states that she noticed twitching and facial droop 2 days ago. Today her sister called her and started that pt was having numbness on the right side. No noticeable facial droop noted at this time.

## 2022-05-27 DIAGNOSIS — J189 Pneumonia, unspecified organism: Secondary | ICD-10-CM

## 2022-05-27 DIAGNOSIS — G9341 Metabolic encephalopathy: Principal | ICD-10-CM

## 2022-05-27 DIAGNOSIS — R531 Weakness: Secondary | ICD-10-CM | POA: Diagnosis not present

## 2022-05-27 DIAGNOSIS — J690 Pneumonitis due to inhalation of food and vomit: Secondary | ICD-10-CM | POA: Diagnosis not present

## 2022-05-27 LAB — GLUCOSE, CAPILLARY: Glucose-Capillary: 87 mg/dL (ref 70–99)

## 2022-05-27 LAB — BASIC METABOLIC PANEL
Anion gap: 7 (ref 5–15)
BUN: 21 mg/dL — ABNORMAL HIGH (ref 6–20)
CO2: 26 mmol/L (ref 22–32)
Calcium: 9.1 mg/dL (ref 8.9–10.3)
Chloride: 109 mmol/L (ref 98–111)
Creatinine, Ser: 1.07 mg/dL (ref 0.61–1.24)
GFR, Estimated: >60 mL/min (ref >60)
Glucose, Bld: 93 mg/dL (ref 70–99)
Potassium: 3.8 mmol/L (ref 3.5–5.1)
Sodium: 142 mmol/L (ref 135–145)

## 2022-05-27 LAB — CBC
HCT: 43.6 % (ref 39.0–52.0)
Hemoglobin: 15 g/dL (ref 13.0–17.0)
MCH: 31.6 pg (ref 26.0–34.0)
MCHC: 34.4 g/dL (ref 30.0–36.0)
MCV: 92 fL (ref 80.0–100.0)
Platelets: 202 10*3/uL (ref 150–400)
RBC: 4.74 MIL/uL (ref 4.22–5.81)
RDW: 12 % (ref 11.5–15.5)
WBC: 8.9 10*3/uL (ref 4.0–10.5)
nRBC: 0 % (ref 0.0–0.2)

## 2022-05-27 MED ORDER — AMOXICILLIN-POT CLAVULANATE 875-125 MG PO TABS: 1.0000 | ORAL_TABLET | Freq: Two times a day (BID) | ORAL | 0 refills | Status: AC

## 2022-05-27 MED ORDER — AMOXICILLIN-POT CLAVULANATE 875-125 MG PO TABS
1.0000 | ORAL_TABLET | Freq: Two times a day (BID) | ORAL | Status: DC
  Administered 2022-05-27: 1 via ORAL
  Filled 2022-05-27: qty 1

## 2022-05-27 MED ORDER — DM-GUAIFENESIN ER 30-600 MG PO TB12: 1.0000 | ORAL_TABLET | Freq: Two times a day (BID) | ORAL | 0 refills | Status: AC | PRN

## 2022-05-27 NOTE — Progress Notes (Signed)
Neurology Progress Note   S:// Seen and examined Completely back to baseline   O:// Current vital signs: BP 109/87 (BP Location: Right Arm)   Pulse 72   Temp 97.7 F (36.5 C)   Resp 18   Ht '5\' 7"'$  (1.702 m)   Wt 86 kg   SpO2 98%   BMI 29.69 kg/m  Vital signs in last 24 hours: Temp:  [97.4 F (36.3 C)-98.4 F (36.9 C)] 97.7 F (36.5 C) (10/21 0810) Pulse Rate:  [65-94] 72 (10/21 0810) Resp:  [16-20] 18 (10/21 0810) BP: (99-122)/(70-93) 109/87 (10/21 0810) SpO2:  [94 %-99 %] 98 % (10/21 0810) Weight:  [86 kg-88 kg] 86 kg (10/20 2124) General: Awake alert in no distress HEENT: Normocephalic atraumatic Chest: Scattered rales Cardiovascular: Regular rhythm Abdomen nondistended nontender Extremities warm well perfused Neurological exam Awake alert oriented x3.  No aphasia.  No dysarthria. Cranial nerves II to XII intact Motor examination with no drift in any of the 4 extremities Sensation intact light touch Coordination with no dysmetria   Medications  Current Facility-Administered Medications:    acetaminophen (TYLENOL) tablet 650 mg, 650 mg, Oral, Q6H PRN, Ivor Costa, MD, 650 mg at 05/26/22 2200   albuterol (PROVENTIL) (2.5 MG/3ML) 0.083% nebulizer solution 3 mL, 3 mL, Inhalation, Q4H PRN, Ivor Costa, MD   Ampicillin-Sulbactam (UNASYN) 3 g in sodium chloride 0.9 % 100 mL IVPB, 3 g, Intravenous, Q6H, Chinita Greenland A, RPH, Last Rate: 200 mL/hr at 05/27/22 0537, 3 g at 05/27/22 0537   atorvastatin (LIPITOR) tablet 20 mg, 20 mg, Oral, Daily, Ivor Costa, MD, 20 mg at 05/26/22 1652   dextromethorphan-guaiFENesin ([place] DM) 30-600 MG per 12 hr tablet 1 tablet, 1 tablet, Oral, BID PRN, Ivor Costa, MD   enoxaparin (LOVENOX) injection 40 mg, 40 mg, Subcutaneous, Q24H, Ivor Costa, MD, 40 mg at 05/26/22 1549   nicotine (NICODERM CQ - dosed in mg/24 hours) patch 21 mg, 21 mg, Transdermal, Daily, Ivor Costa, MD, 21 mg at 05/26/22 1619   ondansetron (ZOFRAN) injection 4 mg, 4  mg, Intravenous, Q8H PRN, Ivor Costa, MD Labs CBC    Component Value Date/Time   WBC 8.9 05/27/2022 0431   RBC 4.74 05/27/2022 0431   HGB 15.0 05/27/2022 0431   HGB 15.6 05/26/2014 1228   HCT 43.6 05/27/2022 0431   HCT 46.9 05/26/2014 1228   PLT 202 05/27/2022 0431   PLT 221 05/26/2014 1228   MCV 92.0 05/27/2022 0431   MCV 100 05/26/2014 1228   MCH 31.6 05/27/2022 0431   MCHC 34.4 05/27/2022 0431   RDW 12.0 05/27/2022 0431   RDW 12.8 05/26/2014 1228   LYMPHSABS 2.2 05/26/2022 0926   MONOABS 0.5 05/26/2022 0926   EOSABS 0.3 05/26/2022 0926   BASOSABS 0.0 05/26/2022 0926    CMP     Component Value Date/Time   NA 142 05/27/2022 0431   NA 137 05/26/2014 1228   K 3.8 05/27/2022 0431   K 4.8 05/26/2014 1228   CL 109 05/27/2022 0431   CL 105 05/26/2014 1228   CO2 26 05/27/2022 0431   CO2 26 05/26/2014 1228   GLUCOSE 93 05/27/2022 0431   GLUCOSE 95 05/26/2014 1228   BUN 21 (H) 05/27/2022 0431   BUN 11 05/26/2014 1228   CREATININE 1.07 05/27/2022 0431   CREATININE 0.87 05/26/2014 1228   CALCIUM 9.1 05/27/2022 0431   CALCIUM 9.2 05/26/2014 1228   PROT 7.0 05/26/2022 0926   PROT 7.8 05/26/2014 1228   ALBUMIN 3.9 05/26/2022  0926   ALBUMIN 3.9 05/26/2014 1228   AST 25 05/26/2022 0926   AST 31 05/26/2014 1228   ALT 18 05/26/2022 0926   ALT 32 05/26/2014 1228   ALKPHOS 83 05/26/2022 0926   ALKPHOS 101 05/26/2014 1228   BILITOT 0.6 05/26/2022 0926   BILITOT 0.4 05/26/2014 1228   GFRNONAA >60 05/27/2022 0431   GFRNONAA >60 05/26/2014 1228   GFRAA >60 07/04/2017 1910   GFRAA >60 05/26/2014 1228    Imaging I have reviewed images in epic and the results pertinent to this consultation: MRI of the brain negative for acute stroke.  Assessment: 56 year old presented for evaluation of right arm weakness and numbness along with altered mental status. MRI brain negative for stroke. Also has a history of migraines and has been complaining of headache for a few days along with  lethargy. Chest x-ray with possible pneumonia UDS positive for benzos although benzos not on his med list, has multiple other sedating medications Differentials at this time: Encephalopathy-likely secondary to pneumonia versus polypharmacy Strokelike symptoms likely secondary to complicated migraine  He is back to baseline and symptoms have resolved.  Recommendations: No further inpatient work-up from a neurological standpoint Continue management of toxic metabolic derangements per primary team as you are Can follow-up with outpatient neurology in 8 to 12 weeks with headaches if desired. Minimize sedating medications Plan relayed to Dr. Tana Coast    -- Amie Portland, MD Neurologist [facility] Pager: [phone]

## 2022-05-27 NOTE — Plan of Care (Signed)
Patient appropriate for discharge to home environment with spouse.  PIV removed and telemetry discontinued.  Augmentin changed to oral administration with initial dose administered.

## 2022-05-27 NOTE — Discharge Summary (Signed)
Physician Discharge Summary   Patient: Eric Garza MRN: [id number] DOB: 09-06-1965  Admit date:     05/26/2022  Discharge date: 05/27/22  Discharge Physician: Estill Cotta, MD    PCP: Idelle Crouch, MD   Recommendations at discharge:   Patient recommended to discontinue Flexeril and Seroquel.  Consider referral to psychiatry or psychology for management of PTSD and adjustment of his psych meds Augmentin 875-125 1 tab p.o. twice daily for 7 days  Discharge Diagnoses:    Acute metabolic encephalopathy resolved, likely polypharmacy Complicated migraine   HLD (hyperlipidemia)   Depression   Tobacco abuse   Aspiration pneumonia ([place])   Polypharmacy_possible polypharmacy   Hospital Course: Patient is a 55 year old male with hyperlipidemia, tobacco use, depression, PTSD presented with confusion, right facial droop, difficulty speaking and cough.  Patient's wife.  Reported that he had been intermittently confused in the past several days.  3 days prior to admission he was noted to have right facial droop, resolved spontaneously.  He has been lethargic and very sleepy.  On the morning of admission, was difficult to be woken up.  Seemed to have right-sided weakness with difficulty speaking and slurred speech.  Patient was noted to be on several sedating medications including Seroquel Zoloft trazodone Flexeril and Valium.  In ED, UDS positive for TCA and benzodiazepine. MRI of the brain negative for stroke.  Chest x-ray showed bibasilar opacities worrisome for aspiration  Assessment and Plan:   * Acute metabolic encephalopathy -Resolved.  MRI of the brain negative for CVA -Patient was followed by neurology, felt likely due to polypharmacy.  Headache likely due to complicated migraine -UDS positive for benzodiazepine, tricyclic -Patient noted to be on multiple sedative medications including Seroquel, Zoloft, trazodone, Flexeril, Valium (per patient was started for back pain) -Continue  Zoloft in the morning and trazodone at bedtime to sleep and PTSD.  -Recommended to discontinue Flexeril, Valium, Seroquel -Also recommend outpatient follow-up with psychiatry or psychology for adjustment of his medications   Polypharmacy - Hold sedative medications as above, as #1  Aspiration pneumonia ([place]) Chest x-ray showed bilateral basilar infiltration.  Given his altered mental status, patient likely has aspiration pneumonia. -Patient was placed on IV Unasyn while inpatient, transition to oral Augmentin for 7 days  Tobacco abuse -Placed on nicotine patch while inpatient  Depression, PTSD -Continue Zoloft in the morning, trazodone at bedtime  HLD (hyperlipidemia) -Continue Lipitor        Pain control - [place] was reviewed. and patient was instructed, not to drive, operate heavy machinery, perform activities at heights, swimming or participation in water activities or provide baby-sitting services while on Pain, Sleep and Anxiety Medications; until their outpatient Physician has advised to do so again. Also recommended to not to take more than prescribed Pain, Sleep and Anxiety Medications.  Consultants: Neurology Procedures performed: MRI brain Disposition: Home Diet recommendation:  Discharge Diet Orders (From admission, onward)     Start     Ordered   05/27/22 0000  Diet - low sodium heart healthy        05/27/22 1026           Cardiac diet DISCHARGE MEDICATION: Allergies as of 05/27/2022       Reactions   Oxycodone         Medication List     STOP taking these medications    cyclobenzaprine 10 MG tablet Commonly known as: FLEXERIL   ketorolac 10 MG tablet Commonly known  as: TORADOL   QUEtiapine 100 MG tablet Commonly known as: SEROQUEL       TAKE these medications    albuterol 108 (90 Base) MCG/ACT inhaler Commonly known as: VENTOLIN HFA SMARTSIG:2 Puff(s) By Mouth Every 4 Hours  PRN   amoxicillin-clavulanate 875-125 MG tablet Commonly known as: AUGMENTIN Take 1 tablet by mouth 2 (two) times daily for 7 days.   atorvastatin 20 MG tablet Commonly known as: LIPITOR Take 20 mg by mouth daily.   cetirizine 10 MG tablet Commonly known as: ZYRTEC Take 10 mg by mouth daily.   dextromethorphan-guaiFENesin 30-600 MG 12hr tablet Commonly known as: MUCINEX DM Take 1 tablet by mouth 2 (two) times daily as needed for cough.   sertraline 100 MG tablet Commonly known as: ZOLOFT Take 100 mg by mouth daily.   traZODone 50 MG tablet Commonly known as: DESYREL Take 150 mg by mouth at bedtime.        Follow-up Information     Sparks, Leonie Douglas, MD Follow up.   Specialty: Internal Medicine Why: for hospital follow-up Contact information: [place] [address] [phone]                Discharge Exam: Danley Danker Weights   05/26/22 6378 05/26/22 2124  Weight: 88 kg 86 kg   S: Alert and oriented, back to baseline, confirmed by wife at the bedside. Wants to go home   Vitals:   05/26/22 2124 05/27/22 0044 05/27/22 0644 05/27/22 0810  BP: 119/83 100/74 104/70 109/87  Pulse: 74 69 68 72  Resp: '17 18 16 18  '$ Temp: 98 F (36.7 C) 97.8 F (36.6 C) 97.6 F (36.4 C) 97.7 F (36.5 C)  TempSrc: Oral     SpO2: 94% 96% 96% 98%  Weight: 86 kg     Height: '5\' 7"'$  (1.702 m)       Physical Exam General: Alert and oriented x 3, NAD Cardiovascular: S1 S2 clear, RRR.  Respiratory: CTAB, no wheezing, rales or rhonchi Gastrointestinal: Soft, nontender, nondistended, NBS Ext: no pedal edema bilaterally Neuro: no new deficits Skin: No rashes Psych: Normal affect and demeanor, alert and oriented x3   Condition at discharge: good  The results of significant diagnostics from this hospitalization (including imaging, microbiology, ancillary and laboratory) are listed below for reference.   Imaging Studies: MR Brain W and Wo  Contrast  Result Date: 05/26/2022 CLINICAL DATA:  Stroke code EXAM: MRI HEAD WITHOUT AND WITH CONTRAST TECHNIQUE: Multiplanar, multiecho pulse sequences of the brain and surrounding structures were obtained without and with intravenous contrast. CONTRAST:  7.62m GADAVIST GADOBUTROL 1 MMOL/ML IV SOLN COMPARISON:  Same-day CT brain/head and neck angiogram FINDINGS: Brain: No acute infarction, hemorrhage, hydrocephalus, extra-axial collection or mass lesion. 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 There are periventricular and subcortical T2/FLAIR hyperintense lesions favored to represent sequela of moderate chronic microvascular ischemic change. There may be mild asymmetric contrast enhancement in the labyrinthine segment of the right facial nerve (series 20, image 45, 43). No other abnormally contrast-enhancing lesion is visualized. Vascular: Normal flow voids. Skull and upper cervical spine: Normal marrow signal. Sinuses/Orbits: Mild mucosal thickening right maxillary and left sphenoid sinus. Other: None. IMPRESSION: 1. No acute intracranial process. 2. Possible mild asymmetric contrast enhancement in the labyrinthine segment of the right facial nerve, which may be artifactual. Correlate with symptoms of right facial neuritis. 3. Scattered periventricular and subcortical T2/FLAIR hyperintense lesions are favored to represent sequela of chronic microvascular ischemic  change. Electronically Signed   By: Marin Roberts M.D.   On: 05/26/2022 12:57   DG Chest 2 View  Result Date: 05/26/2022 CLINICAL DATA:  Cough.  Right-sided weakness. EXAM: CHEST - 2 VIEW COMPARISON:  07/04/17 CXR FINDINGS: No pleural effusion. No pneumothorax. There is consolidative opacity in the left lung base, and to a lesser degree of the right lung base. Visualized upper abdomen is unremarkable. No displaced rib fractures. Cardiac and mediastinal contours are appearance. IMPRESSION: Bibasilar pulmonary opacities are worrisome for  aspiration and/or infection. Electronically Signed   By: Marin Roberts M.D.   On: 05/26/2022 11:00   CT ANGIO HEAD NECK W WO CM (CODE STROKE)  Result Date: 05/26/2022 CLINICAL DATA:  Stroke, follow-up. EXAM: CT ANGIOGRAPHY HEAD AND NECK TECHNIQUE: Multidetector CT imaging of the head and neck was performed using the standard protocol during bolus administration of intravenous contrast. Multiplanar CT image reconstructions and MIPs were obtained to evaluate the vascular anatomy. Carotid stenosis measurements (when applicable) are obtained utilizing NASCET criteria, using the distal internal carotid diameter as the denominator. RADIATION DOSE REDUCTION: This exam was performed according to the departmental dose-optimization program which includes automated exposure control, adjustment of the mA and/or kV according to patient size and/or use of iterative reconstruction technique. CONTRAST:  17m OMNIPAQUE IOHEXOL 350 MG/ML SOLN COMPARISON:  Head CT May 26, 2022. FINDINGS: CTA NECK FINDINGS Aortic arch: Standard branching. Imaged portion shows no evidence of aneurysm or dissection. Atherosclerotic plaques are seen in the aortic arch and at the origin of the major arch arteries without hemodynamically significant stenosis. Right carotid system: A web is seen in the right carotid bulb in addition to mild atherosclerotic changes without hemodynamically significant stenosis. Left carotid system: Mild atherosclerotic changes along the left common carotid artery and in the left carotid bifurcation without hemodynamically significant stenosis. Vertebral arteries: Atherosclerotic changes at the proximal right subclavian artery resulting in mild stenosis. The left vertebral artery is dominant. Calcified atherosclerotic changes are noted at the V1 segment of the left vertebral artery without hemodynamically significant stenosis. The right vertebral artery has normal course and caliber. Skeleton: Negative. Other neck:  Negative. Upper chest: Negative. Review of the MIP images confirms the above findings CTA HEAD FINDINGS Anterior circulation: No significant stenosis, proximal occlusion, aneurysm, or vascular malformation. Posterior circulation: No significant stenosis, proximal occlusion, aneurysm, or vascular malformation. Venous sinuses: As permitted by contrast timing, patent. Anatomic variants: Right fetal PCA. Review of the MIP images confirms the above findings IMPRESSION: 1. No intracranial large vessel occlusion or hemodynamically significant stenosis. 2. Mild atherosclerotic changes of the bilateral carotid bifurcation without hemodynamically significant stenosis. 3. Right carotid web. 4. Aortic atherosclerosis. Aortic Atherosclerosis (ICD10-I70.0). Electronically Signed   By: KPedro EarlsM.D.   On: 05/26/2022 10:32   CT HEAD CODE STROKE WO CONTRAST  Result Date: 05/26/2022 CLINICAL DATA:  Code stroke.  Neuro deficit, acute, stroke suspected EXAM: CT HEAD WITHOUT CONTRAST TECHNIQUE: Contiguous axial images were obtained from the base of the skull through the vertex without intravenous contrast. RADIATION DOSE REDUCTION: This exam was performed according to the departmental dose-optimization program which includes automated exposure control, adjustment of the mA and/or kV according to patient size and/or use of iterative reconstruction technique. COMPARISON:  CT head 03/30/2017. FINDINGS: Brain: No evidence of acute large vascular territory infarction, hemorrhage, hydrocephalus, extra-axial collection or mass lesion/mass effect. Patchy white matter hypodensities, nonspecific but compatible with chronic microvascular ischemic disease. Vascular: No hyperdense vessel identified. Skull: No  acute fracture. Sinuses/Orbits: Clear sinuses.  No acute orbital findings. Other: No mastoid effusions. ASPECTS [facility] Stroke Program Early CT Score) Total score (0-10 with 10 being normal): 10. IMPRESSION: 1. No  evidence of acute intracranial abnormality. 2. ASPECTS is 10. Code stroke imaging results were communicated on 05/26/2022 at 9:58 am to provider Dr. Rory Percy via secure text paging. Electronically Signed   By: Margaretha Sheffield M.D.   On: 05/26/2022 09:59    Microbiology: Results for orders placed or performed during the hospital encounter of 05/26/22  Resp Panel by RT-PCR (Flu A&B, Covid) Anterior Nasal Swab     Status: None   Collection Time: 05/26/22 10:13 AM   Specimen: Anterior Nasal Swab  Result Value Ref Range Status   SARS Coronavirus 2 by RT PCR NEGATIVE NEGATIVE Final    Comment: (NOTE) SARS-CoV-2 target nucleic acids are NOT DETECTED.  The SARS-CoV-2 RNA is generally detectable in upper respiratory specimens during the acute phase of infection. The lowest concentration of SARS-CoV-2 viral copies this assay can detect is 138 copies/mL. A negative result does not preclude SARS-Cov-2 infection and should not be used as the sole basis for treatment or other patient management decisions. A negative result may occur with  improper specimen collection/handling, submission of specimen other than nasopharyngeal swab, presence of viral mutation(s) within the areas targeted by this assay, and inadequate number of viral copies(<138 copies/mL). A negative result must be combined with clinical observations, patient history, and epidemiological information. The expected result is Negative.  Fact Sheet for Patients:  EntrepreneurPulse.com.au  Fact Sheet for Healthcare Providers:  IncredibleEmployment.be  This test is no t yet approved or cleared by the [place] FDA and  has been authorized for detection and/or diagnosis of SARS-CoV-2 by FDA under an Emergency Use Authorization (EUA). This EUA will remain  in effect (meaning this test can be used) for the duration of the COVID-19 declaration under Section 564(b)(1) of the Act, 21 U.S.C.section  360bbb-3(b)(1), unless the authorization is terminated  or revoked sooner.       Influenza A by PCR NEGATIVE NEGATIVE Final   Influenza B by PCR NEGATIVE NEGATIVE Final    Comment: (NOTE) The Xpert Xpress SARS-CoV-2/FLU/RSV plus assay is intended as an aid in the diagnosis of influenza from Nasopharyngeal swab specimens and should not be used as a sole basis for treatment. Nasal washings and aspirates are unacceptable for Xpert Xpress SARS-CoV-2/FLU/RSV testing.  Fact Sheet for Patients: EntrepreneurPulse.com.au  Fact Sheet for Healthcare Providers: IncredibleEmployment.be  This test is not yet approved or cleared by the [place] FDA and has been authorized for detection and/or diagnosis of SARS-CoV-2 by FDA under an Emergency Use Authorization (EUA). This EUA will remain in effect (meaning this test can be used) for the duration of the COVID-19 declaration under Section 564(b)(1) of the Act, 21 U.S.C. section 360bbb-3(b)(1), unless the authorization is terminated or revoked.  Performed at [facility], [address] [place], [place] [address]   Culture, blood (routine x 2) Call MD if unable to obtain prior to antibiotics being given     Status: None (Preliminary result)   Collection Time: 05/26/22  2:37 PM   Specimen: Left Antecubital; Blood  Result Value Ref Range Status   Specimen Description LEFT ANTECUBITAL  Final   Special Requests   Final    BOTTLES DRAWN AEROBIC AND ANAEROBIC Blood Culture adequate volume   Culture   Final    NO GROWTH < 24 HOURS Performed at [facility]  [facility], [address] [place], [place] [address]    Report Status PENDING  Incomplete  Culture, blood (routine x 2) Call MD if unable to obtain prior to antibiotics being given     Status: None (Preliminary result)   Collection Time: 05/26/22  2:42 PM   Specimen: BLOOD LEFT HAND  Result Value Ref Range Status   Specimen Description BLOOD  LEFT HAND  Final   Special Requests   Final    BOTTLES DRAWN AEROBIC AND ANAEROBIC Blood Culture adequate volume   Culture   Final    NO GROWTH < 24 HOURS Performed at [facility], [address] [place],  [address]    Report Status PENDING  Incomplete    Labs: CBC: Recent Labs  Lab 05/26/22 0926 05/27/22 0431  WBC 9.4 8.9  NEUTROABS 6.3  --   HGB 15.5 15.0  HCT 45.5 43.6  MCV 92.3 92.0  PLT 222 741   Basic Metabolic Panel: Recent Labs  Lab 05/26/22 0926 05/27/22 0431  NA 142 142  K 3.8 3.8  CL 110 109  CO2 24 26  GLUCOSE 101* 93  BUN 17 21*  CREATININE 1.00 1.07  CALCIUM 9.3 9.1   Liver Function Tests: Recent Labs  Lab 05/26/22 0926  AST 25  ALT 18  ALKPHOS 83  BILITOT 0.6  PROT 7.0  ALBUMIN 3.9   CBG: Recent Labs  Lab 05/26/22 0928 05/27/22 0812  GLUCAP 112* 87    Discharge time spent: greater than 30 minutes.  Signed: Estill Cotta, MD [facility] 05/27/2022

## 2022-05-31 LAB — CULTURE, BLOOD (ROUTINE X 2)
Culture: NO GROWTH
Culture: NO GROWTH
Special Requests: ADEQUATE
Special Requests: ADEQUATE

## 2022-07-25 ENCOUNTER — Emergency Department: Payer: PRIVATE HEALTH INSURANCE

## 2022-07-25 ENCOUNTER — Emergency Department
Admission: EM | Admit: 2022-07-25 | Discharge: 2022-07-25 | Disposition: A | Discharge status: Home / Self Care | Payer: PRIVATE HEALTH INSURANCE | Attending: Emergency Medicine | Admitting: Emergency Medicine

## 2022-07-25 ENCOUNTER — Other Ambulatory Visit: Payer: Self-pay

## 2022-07-25 DIAGNOSIS — G43809 Other migraine, not intractable, without status migrainosus: Secondary | ICD-10-CM

## 2022-07-25 DIAGNOSIS — G43909 Migraine, unspecified, not intractable, without status migrainosus: Secondary | ICD-10-CM | POA: Insufficient documentation

## 2022-07-25 LAB — CBC WITH DIFFERENTIAL/PLATELET
Abs Immature Granulocytes: 0.02 10*3/uL (ref 0.00–0.07)
Basophils Absolute: 0 10*3/uL (ref 0.0–0.1)
Basophils Relative: 0 %
Eosinophils Absolute: 0.3 10*3/uL (ref 0.0–0.5)
Eosinophils Relative: 3 %
HCT: 44.7 % (ref 39.0–52.0)
Hemoglobin: 15.1 g/dL (ref 13.0–17.0)
Immature Granulocytes: 0 %
Lymphocytes Relative: 30 %
Lymphs Abs: 2.8 10*3/uL (ref 0.7–4.0)
MCH: 31.7 pg (ref 26.0–34.0)
MCHC: 33.8 g/dL (ref 30.0–36.0)
MCV: 93.7 fL (ref 80.0–100.0)
Monocytes Absolute: 0.4 10*3/uL (ref 0.1–1.0)
Monocytes Relative: 5 %
Neutro Abs: 5.8 10*3/uL (ref 1.7–7.7)
Neutrophils Relative %: 62 %
Platelets: 220 10*3/uL (ref 150–400)
RBC: 4.77 MIL/uL (ref 4.22–5.81)
RDW: 12 % (ref 11.5–15.5)
WBC: 9.4 10*3/uL (ref 4.0–10.5)
nRBC: 0 % (ref 0.0–0.2)

## 2022-07-25 LAB — BASIC METABOLIC PANEL
Anion gap: 9 (ref 5–15)
BUN: 13 mg/dL (ref 6–20)
CO2: 27 mmol/L (ref 22–32)
Calcium: 9.6 mg/dL (ref 8.9–10.3)
Chloride: 105 mmol/L (ref 98–111)
Creatinine, Ser: 1.06 mg/dL (ref 0.61–1.24)
GFR, Estimated: >60 mL/min (ref >60)
Glucose, Bld: 82 mg/dL (ref 70–99)
Potassium: 3.9 mmol/L (ref 3.5–5.1)
Sodium: 141 mmol/L (ref 135–145)

## 2022-07-25 MED ORDER — SODIUM CHLORIDE 0.9 % IV SOLN: 12.5000 mg | Freq: Four times a day (QID) | INTRAVENOUS | Status: DC | PRN

## 2022-07-25 MED ORDER — KETOROLAC TROMETHAMINE 15 MG/ML IJ SOLN
15.0000 mg | Freq: Once | INTRAMUSCULAR | Status: AC
  Administered 2022-07-25: 15 mg via INTRAVENOUS
  Filled 2022-07-25: qty 1

## 2022-07-25 MED ORDER — DIPHENHYDRAMINE HCL 50 MG/ML IJ SOLN
25.0000 mg | Freq: Once | INTRAMUSCULAR | Status: AC
  Administered 2022-07-25: 25 mg via INTRAVENOUS
  Filled 2022-07-25: qty 1

## 2022-07-25 MED ORDER — SODIUM CHLORIDE 0.9 % IV BOLUS
1000.0000 mL | Freq: Once | INTRAVENOUS | Status: AC
  Administered 2022-07-25: 1000 mL via INTRAVENOUS

## 2022-07-25 MED ORDER — METOCLOPRAMIDE HCL 5 MG/ML IJ SOLN
10.0000 mg | Freq: Once | INTRAMUSCULAR | Status: AC
  Administered 2022-07-25: 10 mg via INTRAVENOUS
  Filled 2022-07-25: qty 2

## 2022-07-25 NOTE — ED Provider Notes (Signed)
[facility] EMERGENCY DEPARTMENT Provider Note   CSN: [id number] Arrival date & time: 07/25/22  1500     History  Chief Complaint  Patient presents with   Migraine    Eric Garza is a 56 y.o. male with history of hyperlipidemia, smoking, complex migraine headaches presents to the emergency department for evaluation of severe headache.  4 days ago patient developed severe posterior headache along with mild dizziness, nausea, photophobia, double vision left eye.  His symptoms resolved Saturday and Sunday and then returned yesterday and today.  Has been taking butalbital and nortriptyline as prescribed by neurologist for headaches with little relief.  Patient's had some nausea and decreased p.o. intake today.  No fevers.  In triage, patient was given IV fluids, Toradol and Reglan.  After these medications his headache, nausea have completely resolved.  He still feels little dizzy but denies any headache.  He does report some mild left-sided blurred vision.  HPI     Home Medications Prior to Admission medications   Medication Sig Start Date End Date Taking? Authorizing Provider  albuterol (VENTOLIN HFA) 108 (90 Base) MCG/ACT inhaler SMARTSIG:2 Puff(s) By Mouth Every 4 Hours PRN 01/01/22   [provider]  atorvastatin (LIPITOR) 20 MG tablet Take 20 mg by mouth daily. 02/17/22   [provider]  cetirizine (ZYRTEC) 10 MG tablet Take 10 mg by mouth daily.    [provider]  dextromethorphan-guaiFENesin (MUCINEX DM) 30-600 MG 12hr tablet Take 1 tablet by mouth 2 (two) times daily as needed for cough. 05/27/22   Rai, Vernelle Emerald, MD  sertraline (ZOLOFT) 100 MG tablet Take 100 mg by mouth daily. 02/17/22   [provider]  traZODone (DESYREL) 50 MG tablet Take 150 mg by mouth at bedtime.    [provider]      Allergies    Oxycodone    Review of Systems   Review of Systems  Physical Exam Updated Vital Signs BP 116/85 (BP  Location: Left Arm)   Pulse 79   Temp (!) 97.4 F (36.3 C) (Oral)   Resp 16   Ht '5\' 7"'$  (1.702 m)   Wt 86.2 kg   SpO2 100%   BMI 29.76 kg/m  Physical Exam Constitutional:      Appearance: Normal appearance. He is well-developed.     Comments: Able to stand with slightly dizzy  HENT:     Head: Normocephalic and atraumatic.     Right Ear: External ear normal.     Left Ear: External ear normal.     Nose: No congestion.     Mouth/Throat:     Pharynx: No oropharyngeal exudate or posterior oropharyngeal erythema.  Eyes:     General:        Right eye: No discharge.        Left eye: No discharge.     Extraocular Movements: Extraocular movements intact.     Conjunctiva/sclera: Conjunctivae normal.     Pupils: Pupils are equal, round, and reactive to light.  Cardiovascular:     Rate and Rhythm: Normal rate.     Pulses: Normal pulses.     Heart sounds: Normal heart sounds.  Pulmonary:     Effort: Pulmonary effort is normal. No respiratory distress.  Musculoskeletal:        General: Normal range of motion.     Cervical back: Normal range of motion.  Skin:    General: Skin is warm.     Findings: No rash.  Neurological:     General: No focal deficit present.     Mental Status: He is alert and oriented to person, place, and time. Mental status is at baseline.  Psychiatric:        Mood and Affect: Mood normal.        Behavior: Behavior normal.        Thought Content: Thought content normal.     ED Results / Procedures / Treatments   Labs (all labs ordered are listed, but only abnormal results are displayed) Labs Reviewed  CBC WITH DIFFERENTIAL/PLATELET  BASIC METABOLIC PANEL    EKG None  Radiology CT Head Wo Contrast  Result Date: 07/25/2022 CLINICAL DATA:  New onset headache. EXAM: CT HEAD WITHOUT CONTRAST TECHNIQUE: Contiguous axial images were obtained from the base of the skull through the vertex without intravenous contrast. RADIATION DOSE REDUCTION: This exam was  performed according to the departmental dose-optimization program which includes automated exposure control, adjustment of the mA and/or kV according to patient size and/or use of iterative reconstruction technique. COMPARISON:  MRI head 05/26/2022 FINDINGS: Brain: No evidence of acute infarction, hemorrhage, hydrocephalus, extra-axial collection or mass lesion/mass effect. Vascular: Negative for hyperdense vessel Skull: Negative Sinuses/Orbits: Mild mucosal edema sphenoid sinus. Remaining sinuses clear. Negative orbit Other: None IMPRESSION: Negative CT head. Electronically Signed   By: Franchot Gallo M.D.   On: 07/25/2022 15:48    Procedures Procedures    Medications Ordered in ED Medications  sodium chloride 0.9 % bolus 1,000 mL (has no administration in time range)  ketorolac (TORADOL) 15 MG/ML injection 15 mg (15 mg Intravenous Given 07/25/22 1549)  metoCLOPramide (REGLAN) injection 10 mg (10 mg Intravenous Given 07/25/22 1549)  sodium chloride 0.9 % bolus 1,000 mL (1,000 mLs Intravenous New Bag/Given 07/25/22 1549)    ED Course/ Medical Decision Making/ A&P                           Medical Decision Making  56 year old male with complex migraine headaches presents to the emergency department for evaluation of severe headache.  Patient with nausea, photophobia, dizziness.  Symptoms completely resolved with IV fluids, Toradol and Reglan and then started to come back but again resolved with 15 mg of Toradol, fluids and IV Benadryl.  Patient able to walk around the room with no signs of dizziness.  He had no neurological deficits on exam.  No photophobia or blurred vision.  All symptoms resolved.  Patient underwent blood work that was all normal and a CT scan of the head that was negative for any acute intracranial process or masses.  Patient appears well, no neurological deficits and will follow-up with neurologist. Final Clinical Impression(s) / ED Diagnoses Final diagnoses:  None    Rx  / DC Orders ED Discharge Orders     None         Renata Caprice 07/25/22 Ward Chatters    Duffy Bruce, MD 07/25/22 2315

## 2022-07-25 NOTE — Discharge Instructions (Signed)
Please continue with headache medications as needed and follow-up with neurologist.  Return to the ER for any worsening headache, vision changes, urgent changes in your health

## 2022-07-25 NOTE — ED Provider Triage Note (Signed)
Emergency Medicine Provider Triage Evaluation Note  Eric Garza , a 56 y.o. male  was evaluated in triage.  Pt complains of headache. H/o of migraine. In the back of his head. Double vision in left eye. H/o complex migraines. Vomiting at work, reports unsteady when walking. Reports started yesterday. Called neurologist who sent to ER. Fioricet a couple hours ago. Was admitted in October, had CT/CTA/MRI.   Review of Systems  Positive: Headache, vomiting, vision changes Negative: Ataxia, slurred speech, AMS  Physical Exam  There were no vitals taken for this visit. Gen:   Awake, no distress. Wearing sunglasses Resp:  Normal effort  MSK:   Moves extremities without difficulty Other:  Answers questions appropriately  Medical Decision Making  Medically screening exam initiated at 3:25 PM.  Appropriate orders placed.  Eric Garza was informed that the remainder of the evaluation will be completed by another provider, this initial triage assessment does not replace that evaluation, and the importance of remaining in the ED until their evaluation is complete.     Marquette Old, PA-C 07/25/22 1531

## 2022-07-25 NOTE — ED Triage Notes (Signed)
Pt comes in via pov with complaints of a migraine. Patient states that he feels the pain in the back of his head, as well as having double vision in the left eye. Pt has a history of migraines, and is currently on migraine medication and took the his last dose this afternoon at about 1pm. Pt is alert and oriented x4.

## 2022-09-22 ENCOUNTER — Emergency Department
Admission: EM | Admit: 2022-09-22 | Discharge: 2022-09-22 | Disposition: A | Discharge status: Home / Self Care | Payer: No Typology Code available for payment source | Attending: Emergency Medicine | Admitting: Emergency Medicine

## 2022-09-22 ENCOUNTER — Other Ambulatory Visit: Payer: Self-pay

## 2022-09-22 DIAGNOSIS — Z77098 Contact with and (suspected) exposure to other hazardous, chiefly nonmedicinal, chemicals: Secondary | ICD-10-CM

## 2022-09-22 DIAGNOSIS — Y990 Civilian activity done for income or pay: Secondary | ICD-10-CM | POA: Diagnosis not present

## 2022-09-22 DIAGNOSIS — G43909 Migraine, unspecified, not intractable, without status migrainosus: Secondary | ICD-10-CM

## 2022-09-22 MED ORDER — KETOROLAC TROMETHAMINE 30 MG/ML IJ SOLN
30.0000 mg | Freq: Once | INTRAMUSCULAR | Status: AC
  Administered 2022-09-22: 30 mg via INTRAVENOUS
  Filled 2022-09-22: qty 1

## 2022-09-22 MED ORDER — DIPHENHYDRAMINE HCL 50 MG/ML IJ SOLN
25.0000 mg | Freq: Once | INTRAMUSCULAR | Status: AC
  Administered 2022-09-22: 25 mg via INTRAVENOUS
  Filled 2022-09-22: qty 1

## 2022-09-22 MED ORDER — BUTALBITAL-APAP-CAFFEINE 50-325-40 MG PO TABS
2.0000 | ORAL_TABLET | Freq: Once | ORAL | Status: AC
  Administered 2022-09-22: 2 via ORAL
  Filled 2022-09-22: qty 2

## 2022-09-22 MED ORDER — METOCLOPRAMIDE HCL 5 MG/ML IJ SOLN
20.0000 mg | Freq: Once | INTRAVENOUS | Status: AC
  Administered 2022-09-22: 20 mg via INTRAVENOUS
  Filled 2022-09-22: qty 4

## 2022-09-22 MED ORDER — SODIUM CHLORIDE 0.9 % IV SOLN: Freq: Once | INTRAVENOUS | Status: AC

## 2022-09-22 NOTE — ED Provider Notes (Signed)
[facility] Provider Note    Event Date/Time   First MD Initiated Contact with Patient 09/22/22 2040     (approximate)   History   Chemical Exposure (diesel)   HPI  Eric Garza is a 57 y.o. male who reports that he was had diesel splashed into his eyes today at work.  He rinsed his eyes for 15 minutes at the eye station and that seemed to help however he thinks that the diesel fumes have caused him to have one of his complex migraines which she has worked with neurology for some time to get under control.     Physical Exam   Triage Vital Signs: ED Triage Vitals [09/22/22 2024]  Enc Vitals Group     BP (!) 123/98     Pulse Rate 79     Resp 16     Temp 97.6 F (36.4 C)     Temp src      SpO2 98 %     Weight 86 kg (189 lb 9.5 oz)     Height 1.702 m (5' 7"$ )     Head Circumference      Peak Flow      Pain Score 0     Pain Loc      Pain Edu?      Excl. in [place]?     Most recent vital signs: Vitals:   09/22/22 2024 09/22/22 2054  BP: (!) 123/98 (!) 120/91  Pulse: 79 73  Resp: 16 (!) 21  Temp: 97.6 F (36.4 C)   SpO2: 98% 96%     General: Awake, no distress.  CV:  Good peripheral perfusion.  Resp:  Normal effort.  Abd:  No distention.  Other:  No neurodeficits Eyes: No significant erythema, pupils normal, EOMI   ED Results / Procedures / Treatments   Labs (all labs ordered are listed, but only abnormal results are displayed) Labs Reviewed - No data to display   EKG     RADIOLOGY     PROCEDURES:  Critical Care performed:   Procedures   MEDICATIONS ORDERED IN ED: Medications  ketorolac (TORADOL) 30 MG/ML injection 30 mg (30 mg Intravenous Given 09/22/22 2116)  0.9 %  sodium chloride infusion ( Intravenous New Bag/Given 09/22/22 2117)  diphenhydrAMINE (BENADRYL) injection 25 mg (25 mg Intravenous Given 09/22/22 2116)  metoCLOPramide (REGLAN) 20 mg in dextrose 5 % 50 mL IVPB (0 mg Intravenous Stopped 09/22/22 2226)   butalbital-acetaminophen-caffeine (FIORICET) 50-325-40 MG per tablet 2 tablet (2 tablets Oral Given 09/22/22 2229)     IMPRESSION / MDM / ASSESSMENT AND PLAN / ED COURSE  I reviewed the triage vital signs and the nursing notes. Patient's presentation is most consistent with exacerbation of chronic illness.  Patient presents with chemical exposure to the eye, will have him rinse again at the eye station for 15 minutes, both eyes  His primary complaint is migraine headache which she has had many times in the past.  He attributes this to the exposure to the diesel.  Will give IV Reglan, IV Toradol, IV fluids, IV Benadryl and reevaluate  ----------------------------------------- 10:59 PM on 09/22/2022 ----------------------------------------- Patient is feeling significantly better, headache is almost resolved, he would like to go home, I feel this is appropriate.      FINAL CLINICAL IMPRESSION(S) / ED DIAGNOSES   Final diagnoses:  Migraine without status migrainosus, not intractable, unspecified migraine type  Chemical exposure of eye     Rx / DC  Orders   ED Discharge Orders     None        Note:  This document was prepared using Dragon voice recognition software and may include unintentional dictation errors.   Lavonia Drafts, MD 09/22/22 2300

## 2022-09-22 NOTE — ED Triage Notes (Addendum)
Pt to ED from home with family. 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 Pt did wash his eyes out at work in the eye wash station. Pt called nurse line and they told him to come here.   Pt is nauseated and has a migraine but pt has HX of migraines.   Pt denies any blurred vision or other symptoms.

## 2022-09-29 ENCOUNTER — Emergency Department
Admission: EM | Admit: 2022-09-29 | Discharge: 2022-09-29 | Disposition: A | Discharge status: Home / Self Care | Payer: No Typology Code available for payment source | Attending: Emergency Medicine | Admitting: Emergency Medicine

## 2022-09-29 ENCOUNTER — Emergency Department: Payer: No Typology Code available for payment source

## 2022-09-29 ENCOUNTER — Other Ambulatory Visit: Payer: Self-pay

## 2022-09-29 DIAGNOSIS — G43819 Other migraine, intractable, without status migrainosus: Secondary | ICD-10-CM

## 2022-09-29 DIAGNOSIS — G43909 Migraine, unspecified, not intractable, without status migrainosus: Secondary | ICD-10-CM | POA: Diagnosis present

## 2022-09-29 DIAGNOSIS — R791 Abnormal coagulation profile: Secondary | ICD-10-CM | POA: Diagnosis not present

## 2022-09-29 LAB — CBC
HCT: 43.5 % (ref 39.0–52.0)
Hemoglobin: 14.9 g/dL (ref 13.0–17.0)
MCH: 32.1 pg (ref 26.0–34.0)
MCHC: 34.3 g/dL (ref 30.0–36.0)
MCV: 93.8 fL (ref 80.0–100.0)
Platelets: 217 10*3/uL (ref 150–400)
RBC: 4.64 MIL/uL (ref 4.22–5.81)
RDW: 11.9 % (ref 11.5–15.5)
WBC: 10.4 10*3/uL (ref 4.0–10.5)
nRBC: 0 % (ref 0.0–0.2)

## 2022-09-29 LAB — COMPREHENSIVE METABOLIC PANEL
ALT: 17 U/L (ref 0–44)
AST: 29 U/L (ref 15–41)
Albumin: 4.1 g/dL (ref 3.5–5.0)
Alkaline Phosphatase: 89 U/L (ref 38–126)
Anion gap: 11 (ref 5–15)
BUN: 16 mg/dL (ref 6–20)
CO2: 25 mmol/L (ref 22–32)
Calcium: 9.5 mg/dL (ref 8.9–10.3)
Chloride: 105 mmol/L (ref 98–111)
Creatinine, Ser: 1.12 mg/dL (ref 0.61–1.24)
GFR, Estimated: >60 mL/min (ref >60)
Glucose, Bld: 120 mg/dL — ABNORMAL HIGH (ref 70–99)
Potassium: 3.7 mmol/L (ref 3.5–5.1)
Sodium: 141 mmol/L (ref 135–145)
Total Bilirubin: 0.5 mg/dL (ref 0.3–1.2)
Total Protein: 7.3 g/dL (ref 6.5–8.1)

## 2022-09-29 LAB — DIFFERENTIAL
Abs Immature Granulocytes: 0.04 10*3/uL (ref 0.00–0.07)
Basophils Absolute: 0 10*3/uL (ref 0.0–0.1)
Basophils Relative: 0 %
Eosinophils Absolute: 0.4 10*3/uL (ref 0.0–0.5)
Eosinophils Relative: 4 %
Immature Granulocytes: 0 %
Lymphocytes Relative: 27 %
Lymphs Abs: 2.8 10*3/uL (ref 0.7–4.0)
Monocytes Absolute: 0.4 10*3/uL (ref 0.1–1.0)
Monocytes Relative: 4 %
Neutro Abs: 6.6 10*3/uL (ref 1.7–7.7)
Neutrophils Relative %: 65 %

## 2022-09-29 LAB — ETHANOL: Alcohol, Ethyl (B): <10 mg/dL (ref <10)

## 2022-09-29 LAB — PROTIME-INR
INR: 1 (ref 0.8–1.2)
Prothrombin Time: 12.6 seconds (ref 11.4–15.2)

## 2022-09-29 LAB — APTT: aPTT: 31 seconds (ref 24–36)

## 2022-09-29 MED ORDER — PROMETHAZINE HCL 25 MG/ML IJ SOLN
25.0000 mg | Freq: Once | INTRAMUSCULAR | Status: AC
  Administered 2022-09-29: 25 mg via INTRAMUSCULAR
  Filled 2022-09-29: qty 1

## 2022-09-29 MED ORDER — SUMATRIPTAN SUCCINATE 6 MG/0.5ML ~~LOC~~ SOLN
6.0000 mg | Freq: Once | SUBCUTANEOUS | Status: AC
  Administered 2022-09-29: 6 mg via SUBCUTANEOUS
  Filled 2022-09-29: qty 0.5

## 2022-09-29 MED ORDER — SODIUM CHLORIDE 0.9% FLUSH: 3.0000 mL | Freq: Once | INTRAVENOUS | Status: DC

## 2022-09-29 MED ORDER — DIPHENHYDRAMINE HCL 25 MG PO CAPS
50.0000 mg | ORAL_CAPSULE | Freq: Once | ORAL | Status: AC
  Administered 2022-09-29: 50 mg via ORAL
  Filled 2022-09-29: qty 2

## 2022-09-29 MED ORDER — KETOROLAC TROMETHAMINE 30 MG/ML IJ SOLN
30.0000 mg | Freq: Once | INTRAMUSCULAR | Status: AC
  Administered 2022-09-29: 30 mg via INTRAMUSCULAR
  Filled 2022-09-29: qty 1

## 2022-09-29 NOTE — ED Triage Notes (Signed)
Pt to ED via POV from home. Pt reports hx complex migraine. Right arm is numb, right foot is dragging and right eye is drooping. Wife states symptoms started at 15am upon waking and took a Fioricet.

## 2022-09-29 NOTE — ED Notes (Signed)
Pt verbalizes understanding of discharge instructions. Opportunity for questioning and answers were provided. Pt discharged from ED to home with significant other.   ? ?

## 2022-09-29 NOTE — ED Provider Notes (Signed)
[facility] Provider Note  Patient Contact: 8:49 PM (approximate)   History   Migraine   HPI  Eric Garza is a 57 y.o. male who presents the emergency department complaining of complex migraine.  Patient has a history of same.  He typically has neurodeficits such as slurred speech, facial droop, unilateral weakness.  Patient has been working with neurology to find a combination of medications to help alleviate his recurrent migraines.  Patient states that he is currently on nortriptyline with emergency relief with Fioricet.  He been doing well until last week he had some diesel fuel splashed in his face which triggered a migraine and had another 1 tonight.  No atypical symptoms.  Patient follows with [facility] clinic neurology.     Physical Exam   Triage Vital Signs: ED Triage Vitals  Enc Vitals Group     BP 09/29/22 1744 123/87     Pulse Rate 09/29/22 1744 86     Resp 09/29/22 1744 (!) 22     Temp 09/29/22 1744 (!) 97.4 F (36.3 C)     Temp Source 09/29/22 1744 Oral     SpO2 09/29/22 1744 100 %     Weight --      Height --      Head Circumference --      Peak Flow --      Pain Score 09/29/22 1745 4     Pain Loc --      Pain Edu? --      Excl. in [place]? --     Most recent vital signs: Vitals:   09/29/22 1744  BP: 123/87  Pulse: 86  Resp: (!) 22  Temp: (!) 97.4 F (36.3 C)  SpO2: 100%     General: Alert and in no acute distress. Head: No acute traumatic findings  Neck: No stridor. No cervical spine tenderness to palpation.  Cardiovascular:  Good peripheral perfusion Respiratory: Normal respiratory effort without tachypnea or retractions. Lungs CTAB. Good air entry to the bases with no decreased or absent breath sounds Gastrointestinal: Bowel sounds 4 quadrants. Soft and nontender to palpation. No guarding or rigidity. No palpable masses. No distention. No CVA tenderness. Musculoskeletal: Full range of motion to all extremities.   Neurologic:  No gross focal neurologic deficits are appreciated.  Patient has slight droop of the right eye, weakness of the right upper extremity when compared to left.  Otherwise cranial nerves intact. Skin:   No rash noted Other:   ED Results / Procedures / Treatments   Labs (all labs ordered are listed, but only abnormal results are displayed) Labs Reviewed  COMPREHENSIVE METABOLIC PANEL - Abnormal; Notable for the following components:      Result Value   Glucose, Bld 120 (*)    All other components within normal limits  PROTIME-INR  APTT  CBC  DIFFERENTIAL  ETHANOL     EKG     RADIOLOGY  I personally viewed, evaluated, and interpreted these images as part of my medical decision making, as well as reviewing the written report by the radiologist.  ED Provider Interpretation: No acute intracranial abnormality.  CT HEAD WO CONTRAST  Result Date: 09/29/2022 CLINICAL DATA:  Provided history: Neuro Technologist notes state migraine. EXAM: CT HEAD WITHOUT CONTRAST TECHNIQUE: Contiguous axial images were obtained from the base of the skull through the vertex without intravenous contrast. RADIATION DOSE REDUCTION: This exam was performed according to the departmental dose-optimization program which includes automated exposure control, adjustment of  the mA and/or kV according to patient size and/or use of iterative reconstruction technique. COMPARISON:  Head CT 07/25/2022 FINDINGS: Brain: No intracranial hemorrhage, mass effect, or midline shift. No hydrocephalus. The basilar cisterns are patent. Similar appearance of mild chronic vessel ischemic change. No evidence of territorial infarct or acute ischemia. No extra-axial or intracranial fluid collection. Vascular: No hyperdense vessel or unexpected calcification. Skull: No fracture or focal lesion. Sinuses/Orbits: Chronic mucosal thickening of left side of sphenoid sinus. No sinus fluid levels. No mastoid effusion. Other: None.  IMPRESSION: 1. No acute intracranial abnormality. 2. Unchanged mild chronic vessel ischemic change. Electronically Signed   By: Keith Rake M.D.   On: 09/29/2022 18:15    PROCEDURES:  Critical Care performed: No  Procedures   MEDICATIONS ORDERED IN ED: Medications  sodium chloride flush (NS) 0.9 % injection 3 mL ( Intravenous Canceled Entry 09/29/22 1829)  SUMAtriptan (IMITREX) injection 6 mg (has no administration in time range)  ketorolac (TORADOL) 30 MG/ML injection 30 mg (has no administration in time range)  promethazine (PHENERGAN) injection 25 mg (has no administration in time range)  diphenhydrAMINE (BENADRYL) capsule 50 mg (has no administration in time range)     IMPRESSION / MDM / ASSESSMENT AND PLAN / ED COURSE  I reviewed the triage vital signs and the nursing notes.                                 Differential diagnosis includes, but is not limited to, migraine, CVA, ischemic stroke, hemorrhagic stroke  Patient's presentation is most consistent with acute presentation with potential threat to life or bodily function.   Patient's diagnosis is consistent with complex migraine.  Patient presents emergency department with a history of complex migraines.  Has been followed by neurology they been attempting to control his headaches with medications.  Patient was doing well on a new regimen of nortriptyline and Fioricet.  He had diesel splashed into his face last week which triggered his first migraine in the month and had a return of migraine this morning.  He was taking his nortriptyline and took Fioricet earlier today with no relief.  Patient does have some right eyelid droop and right-sided weakness which is consistent with his normal migraine.  There is no atypical features according to the patient and his wife.  Patient had labs, CT scan which revealed no acute findings.  Given the history I suspect that this is a complex migraine.  Will with migraine cocktail of  Imitrex, Toradol, Phenergan and diphenhydramine.  Follow back with his neurology provider to discuss ongoing migraines.  Concerning signs and symptoms and return precautions discussed with the patient and his wife.. Patient is given ED precautions to return to the ED for any worsening or new symptoms.     FINAL CLINICAL IMPRESSION(S) / ED DIAGNOSES   Final diagnoses:  Other migraine without status migrainosus, intractable     Rx / DC Orders   ED Discharge Orders     None        Note:  This document was prepared using Dragon voice recognition software and may include unintentional dictation errors.   Brynda Peon 09/29/22 2057    Rada Hay, MD 09/30/22 [phone]

## 2023-03-29 ENCOUNTER — Ambulatory Visit
Admission: RE | Admit: 2023-03-29 | Discharge: 2023-03-29 | Disposition: A | Discharge status: Home / Self Care | Payer: Commercial Managed Care - PPO | Source: Ambulatory Visit

## 2023-03-29 VITALS — BP 105/59 | HR 100 | Temp 98.2°F | Resp 20

## 2023-03-29 DIAGNOSIS — J069 Acute upper respiratory infection, unspecified: Secondary | ICD-10-CM

## 2023-03-29 HISTORY — DX: Migraine, unspecified, not intractable, without status migrainosus: G43.909

## 2023-03-29 MED ORDER — ONDANSETRON 4 MG PO TBDP: 4.0000 mg | ORAL_TABLET | Freq: Three times a day (TID) | ORAL | 0 refills | Status: AC | PRN

## 2023-03-29 MED ORDER — AZITHROMYCIN 250 MG PO TABS: 250.0000 mg | ORAL_TABLET | Freq: Every day | ORAL | 0 refills | Status: DC

## 2023-03-29 MED ORDER — LOPERAMIDE HCL 2 MG PO CAPS: 2.0000 mg | ORAL_CAPSULE | Freq: Four times a day (QID) | ORAL | 0 refills | Status: AC | PRN

## 2023-03-29 MED ORDER — PROMETHAZINE-DM 6.25-15 MG/5ML PO SYRP: 5.0000 mL | ORAL_SOLUTION | Freq: Every evening | ORAL | 0 refills | Status: DC | PRN

## 2023-03-29 MED ORDER — PREDNISONE 20 MG PO TABS: 40.0000 mg | ORAL_TABLET | Freq: Every day | ORAL | 0 refills | Status: DC

## 2023-03-29 NOTE — Discharge Instructions (Signed)
lungs are clear and you are getting enough air without assistance, at this time I do not believe you have pneumonia or bronchitis and therefore we will hold off on completing a chest x-ray  As your cough started 2 weeks ago and you are now beginning to experience shortness of breath will start antibiotic to prevent symptoms from progressing to more serious illness  Take azithromycin as directed for bacterial coverage  Begin prednisone every morning with food for 5 days to open and relax the airway which should help reduce shortness of breath and settle persistent coughing  May use cough syrup at bedtime for additional comfort, be mindful this will make you feel sleepy  May use Zofran every 8 hours for vomiting place underneath tongue and allowed to dissolve wait least 30 minutes before attempting to eat or drink, increase your fluid intake until you are able to eat at your normal baseline  Use Imodium every 6 hours as needed for diarrhea, do not overuse medicine as it was the opposite effect of constipation    You can take Tylenol and/or Ibuprofen as needed for fever reduction and pain relief.   For cough: honey 1/2 to 1 teaspoon (you can dilute the honey in water or another fluid).  You can use a humidifier for chest congestion and cough.  If you don't have a humidifier, you can sit in the bathroom with the hot shower running.      For sore throat: try warm salt water gargles, cepacol lozenges, throat spray, warm tea or water with lemon/honey, popsicles or ice, or OTC cold relief medicine for throat discomfort.   For congestion: take a daily anti-histamine like Zyrtec, Claritin, and a oral decongestant, such as pseudoephedrine.  You can also use Flonase 1-2 sprays in each nostril daily.   It is important to stay hydrated: drink plenty of fluids (water, gatorade/powerade/pedialyte, juices, or teas) to keep your throat moisturized and help further relieve irritation/discomfort.

## 2023-03-29 NOTE — ED Triage Notes (Signed)
Patient to Urgent Care with complaints of dry cough, fevers, diarrhea and vomiting, shortness of breath.  Symptoms started approx two weeks ago. Sick coworker. Started NVD and SHOB this morning. Temp this morning unsure how high (worse over the weekend).

## 2023-03-29 NOTE — ED Provider Notes (Signed)
Eric Garza    CSN: [id number] Arrival date & time: 03/29/23  0840      History   Chief Complaint Chief Complaint  Patient presents with   Cough    Fever diarrhea vomiting - Entered by patient    HPI Eric Garza is a 57 y.o. male.   Patient presents for evaluation of subjective fever and a nonproductive cough present for 2 weeks.  Over the last 3 days has been experiencing shortness of breath at rest exacerbated by exertion.  Began to experience vomiting and diarrhea this morning upon awakening, has vomited at least 4 times and had 3 episodes of soft and watery diarrhea.  Unable to tolerate food or liquids.  Over the last 2 weeks appetite has been decreased.  Known sick contacts at work.  Denies congestion, ear pain or sore throat, wheezing.  History of bronchitis and pneumonia.  Daily tobacco use.  Has attempted use of DayQuil.   Past Medical History:  Diagnosis Date   Depression    HLD (hyperlipidemia)    Migraine    Pre-diabetes    Tobacco abuse     Patient Active Problem List   Diagnosis Date Noted   Acute metabolic encephalopathy 05/26/2022   HLD (hyperlipidemia) 05/26/2022   Depression 05/26/2022   Tobacco abuse 05/26/2022   Aspiration pneumonia (HCC) 05/26/2022   Polypharmacy_possible polypharmacy 05/26/2022   Encounter for screening colonoscopy     Past Surgical History:  Procedure Laterality Date   COLONOSCOPY WITH PROPOFOL N/A 06/27/2019   Procedure: COLONOSCOPY WITH PROPOFOL;  Surgeon: Toney Reil, MD;  Location: [facility] ENDOSCOPY;  Service: Gastroenterology;  Laterality: N/A;       Home Medications    Prior to Admission medications   Medication Sig Start Date End Date Taking? Authorizing Provider  azithromycin (ZITHROMAX) 250 MG tablet Take 1 tablet (250 mg total) by mouth daily. Take first 2 tablets together, then 1 every day until finished. 03/29/23  Yes Crayton Savarese, Elita Boone, NP  butalbital-acetaminophen-caffeine (FIORICET) 50-325-40 MG  tablet Take by mouth. 03/14/23  Yes [provider]  loperamide (IMODIUM) 2 MG capsule Take 1 capsule (2 mg total) by mouth 4 (four) times daily as needed for diarrhea or loose stools. 03/29/23  Yes Adalai Perl R, NP  nortriptyline (PAMELOR) 50 MG capsule Take by mouth. 03/14/23  Yes [provider]  ondansetron (ZOFRAN-ODT) 4 MG disintegrating tablet Take 1 tablet (4 mg total) by mouth every 8 (eight) hours as needed for nausea or vomiting. 03/29/23  Yes Pakou Rainbow R, NP  predniSONE (DELTASONE) 20 MG tablet Take 2 tablets (40 mg total) by mouth daily. 03/29/23  Yes Liesel Peckenpaugh, Elita Boone, NP  promethazine-dextromethorphan (PROMETHAZINE-DM) 6.25-15 MG/5ML syrup Take 5 mLs by mouth at bedtime as needed for cough. 03/29/23  Yes Valinda Hoar, NP  QUEtiapine (SEROQUEL) 100 MG tablet Take by mouth. 02/26/23  Yes [provider]  albuterol (VENTOLIN HFA) 108 (90 Base) MCG/ACT inhaler SMARTSIG:2 Puff(s) By Mouth Every 4 Hours PRN 01/01/22   [provider]  atorvastatin (LIPITOR) 20 MG tablet Take 20 mg by mouth daily. 02/17/22   [provider]  cetirizine (ZYRTEC) 10 MG tablet Take 10 mg by mouth daily.    [provider]  dextromethorphan-guaiFENesin (MUCINEX DM) 30-600 MG 12hr tablet Take 1 tablet by mouth 2 (two) times daily as needed for cough. 05/27/22   Rai, Delene Ruffini, MD  sertraline (ZOLOFT) 100 MG tablet Take 100 mg by mouth daily. 02/17/22  [provider]  traZODone (DESYREL) 50 MG tablet Take 150 mg by mouth at bedtime.    [provider]    Family History History reviewed. No pertinent family history.  Social History Social History   Tobacco Use   Smoking status: Every Day    Current packs/day: 1.00    Types: Cigarettes   Smokeless tobacco: Never  Vaping Use   Vaping status: Never Used  Substance Use Topics   Alcohol use: No   Drug use: Never     Allergies   Oxycodone   Review of Systems Review of  Systems  Constitutional:  Positive for fever. Negative for activity change, appetite change, chills, diaphoresis, fatigue and unexpected weight change.  HENT: Negative.    Respiratory:  Positive for cough and shortness of breath. Negative for apnea, choking, chest tightness, wheezing and stridor.   Cardiovascular: Negative.   Gastrointestinal: Negative.      Physical Exam Triage Vital Signs ED Triage Vitals  Encounter Vitals Group     BP 03/29/23 0850 (!) 105/59     Systolic BP Percentile --      Diastolic BP Percentile --      Pulse Rate 03/29/23 0850 100     Resp 03/29/23 0850 20     Temp 03/29/23 0850 98.2 F (36.8 C)     Temp src --      SpO2 03/29/23 0850 98 %     Weight --      Height --      Head Circumference --      Peak Flow --      Pain Score 03/29/23 0845 2     Pain Loc --      Pain Education --      Exclude from Growth Chart --    No data found.  Updated Vital Signs BP (!) 105/59   Pulse 100   Temp 98.2 F (36.8 C)   Resp 20   SpO2 98%   Visual Acuity Right Eye Distance:   Left Eye Distance:   Bilateral Distance:    Right Eye Near:   Left Eye Near:    Bilateral Near:     Physical Exam Constitutional:      Appearance: Normal appearance.  HENT:     Head: Normocephalic.     Right Ear: Tympanic membrane, ear canal and external ear normal.     Left Ear: Tympanic membrane and external ear normal.     Nose: Nose normal.     Mouth/Throat:     Mouth: Mucous membranes are moist.     Pharynx: Oropharynx is clear. No posterior oropharyngeal erythema.  Eyes:     Extraocular Movements: Extraocular movements intact.  Cardiovascular:     Rate and Rhythm: Normal rate and regular rhythm.     Pulses: Normal pulses.     Heart sounds: Normal heart sounds.  Pulmonary:     Effort: Pulmonary effort is normal.     Breath sounds: Normal breath sounds.     Comments: Nonproductive cough witnessed Abdominal:     General: Abdomen is flat. Bowel sounds are  normal. There is no distension.     Palpations: Abdomen is soft.     Tenderness: There is no abdominal tenderness. There is no guarding.  Skin:    General: Skin is warm.  Neurological:     Mental Status: He is alert and oriented to person, place, and time. Mental status is at baseline.  UC Treatments / Results  Labs (all labs ordered are listed, but only abnormal results are displayed) Labs Reviewed - No data to display  EKG   Radiology No results found.  Procedures Procedures (including critical care time)  Medications Ordered in UC Medications - No data to display  Initial Impression / Assessment and Plan / UC Course  I have reviewed the triage vital signs and the nursing notes.  Pertinent labs & imaging results that were available during my care of the patient were reviewed by me and considered in my medical decision making (see chart for details).  Acute upper respiratory infection  Vital signs are stable, patient is in no signs of distress nontoxic-appearing, lungs clear to auscultation and O2 saturation 98% on room air, low suspicion for pneumonia or bronchitis at this time, stable for outpatient management, imaging deferred, as initial symptoms began 2 weeks ago and now progressing as he is experiencing shortness of breath will provide bacterial coverage with azithromycin as well as prednisone and Promethazine DM for management of shortness of breath and cough, Zofran and Imodium prescribed for vomiting and diarrhea, advised increase fluid intake until able to tolerate food at baseline, may use additional over-the-counter medications as needed, advised follow-up if symptoms persist worsen or he begins to have any further difficulty breathing Final Clinical Impressions(s) / UC Diagnoses   Final diagnoses:  Acute URI     Discharge Instructions       lungs are clear and you are getting enough air without assistance, at this time I do not believe you have  pneumonia or bronchitis and therefore we will hold off on completing a chest x-ray  As your cough started 2 weeks ago and you are now beginning to experience shortness of breath will start antibiotic to prevent symptoms from progressing to more serious illness  Take azithromycin as directed for bacterial coverage  Begin prednisone every morning with food for 5 days to open and relax the airway which should help reduce shortness of breath and settle persistent coughing  May use cough syrup at bedtime for additional comfort, be mindful this will make you feel sleepy  May use Zofran every 8 hours for vomiting place underneath tongue and allowed to dissolve wait least 30 minutes before attempting to eat or drink, increase your fluid intake until you are able to eat at your normal baseline  Use Imodium every 6 hours as needed for diarrhea, do not overuse medicine as it was the opposite effect of constipation    You can take Tylenol and/or Ibuprofen as needed for fever reduction and pain relief.   For cough: honey 1/2 to 1 teaspoon (you can dilute the honey in water or another fluid).  You can use a humidifier for chest congestion and cough.  If you don't have a humidifier, you can sit in the bathroom with the hot shower running.      For sore throat: try warm salt water gargles, cepacol lozenges, throat spray, warm tea or water with lemon/honey, popsicles or ice, or OTC cold relief medicine for throat discomfort.   For congestion: take a daily anti-histamine like Zyrtec, Claritin, and a oral decongestant, such as pseudoephedrine.  You can also use Flonase 1-2 sprays in each nostril daily.   It is important to stay hydrated: drink plenty of fluids (water, gatorade/powerade/pedialyte, juices, or teas) to keep your throat moisturized and help further relieve irritation/discomfort.    ED Prescriptions     Medication Sig Dispense Auth. Provider  azithromycin (ZITHROMAX) 250 MG tablet Take 1  tablet (250 mg total) by mouth daily. Take first 2 tablets together, then 1 every day until finished. 6 tablet Djimon Lundstrom R, NP   predniSONE (DELTASONE) 20 MG tablet Take 2 tablets (40 mg total) by mouth daily. 10 tablet Salli Quarry R, NP   promethazine-dextromethorphan (PROMETHAZINE-DM) 6.25-15 MG/5ML syrup Take 5 mLs by mouth at bedtime as needed for cough. 118 mL Brelynn Wheller R, NP   ondansetron (ZOFRAN-ODT) 4 MG disintegrating tablet Take 1 tablet (4 mg total) by mouth every 8 (eight) hours as needed for nausea or vomiting. 20 tablet Gannon Heinzman, Hansel Starling R, NP   loperamide (IMODIUM) 2 MG capsule Take 1 capsule (2 mg total) by mouth 4 (four) times daily as needed for diarrhea or loose stools. 12 capsule Valinda Hoar, NP      PDMP not reviewed this encounter.   Valinda Hoar, [place] 03/29/23 [phone]

## 2023-05-06 ENCOUNTER — Emergency Department
Admission: EM | Admit: 2023-05-06 | Discharge: 2023-05-06 | Disposition: A | Discharge status: Home / Self Care | Payer: No Typology Code available for payment source | Attending: Emergency Medicine | Admitting: Emergency Medicine

## 2023-05-06 ENCOUNTER — Other Ambulatory Visit: Payer: Self-pay

## 2023-05-06 DIAGNOSIS — G43E11 Chronic migraine with aura, intractable, with status migrainosus: Secondary | ICD-10-CM | POA: Diagnosis not present

## 2023-05-06 DIAGNOSIS — G43909 Migraine, unspecified, not intractable, without status migrainosus: Secondary | ICD-10-CM | POA: Diagnosis present

## 2023-05-06 LAB — CBC WITH DIFFERENTIAL/PLATELET
Abs Immature Granulocytes: 0.02 10*3/uL (ref 0.00–0.07)
Basophils Absolute: 0 10*3/uL (ref 0.0–0.1)
Basophils Relative: 0 %
Eosinophils Absolute: 0.3 10*3/uL (ref 0.0–0.5)
Eosinophils Relative: 5 %
HCT: 42.1 % (ref 39.0–52.0)
Hemoglobin: 14.5 g/dL (ref 13.0–17.0)
Immature Granulocytes: 0 %
Lymphocytes Relative: 33 %
Lymphs Abs: 2.2 10*3/uL (ref 0.7–4.0)
MCH: 32.2 pg (ref 26.0–34.0)
MCHC: 34.4 g/dL (ref 30.0–36.0)
MCV: 93.3 fL (ref 80.0–100.0)
Monocytes Absolute: 0.4 10*3/uL (ref 0.1–1.0)
Monocytes Relative: 6 %
Neutro Abs: 3.6 10*3/uL (ref 1.7–7.7)
Neutrophils Relative %: 56 %
Platelets: 209 10*3/uL (ref 150–400)
RBC: 4.51 MIL/uL (ref 4.22–5.81)
RDW: 12 % (ref 11.5–15.5)
WBC: 6.6 10*3/uL (ref 4.0–10.5)
nRBC: 0 % (ref 0.0–0.2)

## 2023-05-06 LAB — BASIC METABOLIC PANEL
Anion gap: 7 (ref 5–15)
BUN: 18 mg/dL (ref 6–20)
CO2: 24 mmol/L (ref 22–32)
Calcium: 9 mg/dL (ref 8.9–10.3)
Chloride: 110 mmol/L (ref 98–111)
Creatinine, Ser: 0.93 mg/dL (ref 0.61–1.24)
GFR, Estimated: >60 mL/min (ref >60)
Glucose, Bld: 110 mg/dL — ABNORMAL HIGH (ref 70–99)
Potassium: 3.5 mmol/L (ref 3.5–5.1)
Sodium: 141 mmol/L (ref 135–145)

## 2023-05-06 MED ORDER — KETOROLAC TROMETHAMINE 15 MG/ML IJ SOLN
15.0000 mg | Freq: Once | INTRAMUSCULAR | Status: AC
  Administered 2023-05-06: 15 mg via INTRAVENOUS
  Filled 2023-05-06: qty 1

## 2023-05-06 MED ORDER — PROCHLORPERAZINE EDISYLATE 10 MG/2ML IJ SOLN
5.0000 mg | Freq: Once | INTRAMUSCULAR | Status: AC
  Administered 2023-05-06: 5 mg via INTRAVENOUS
  Filled 2023-05-06: qty 2

## 2023-05-06 MED ORDER — DIPHENHYDRAMINE HCL 50 MG/ML IJ SOLN
50.0000 mg | Freq: Once | INTRAMUSCULAR | Status: AC
  Administered 2023-05-06: 50 mg via INTRAVENOUS
  Filled 2023-05-06: qty 1

## 2023-05-06 MED ORDER — LACTATED RINGERS IV BOLUS
1000.0000 mL | Freq: Once | INTRAVENOUS | Status: AC
  Administered 2023-05-06: 1000 mL via INTRAVENOUS

## 2023-05-06 NOTE — ED Provider Notes (Signed)
[facility] Provider Note    Event Date/Time   First MD Initiated Contact with Patient 05/06/23 1413     (approximate)   History   Migraine   HPI Eric Garza is a 57 y.o. male with history of complex migraines presenting today for migraine.  Patient has frequent migraine symptoms at baseline with most recent headache starting last night.  He has associated nausea and lightheadedness with his typical with his migraines.  He has been trying his emergency medication which is Fioricet multiple times over the past 24 hours without relief.  He denies any new neurological symptoms such as vision changes, numbness, or weakness anywhere.     Physical Exam   Triage Vital Signs: ED Triage Vitals  Encounter Vitals Group     BP 05/06/23 1406 (!) 116/93     Systolic BP Percentile --      Diastolic BP Percentile --      Pulse Rate 05/06/23 1406 94     Resp 05/06/23 1406 18     Temp 05/06/23 1406 97.9 F (36.6 C)     Temp Source 05/06/23 1406 Oral     SpO2 05/06/23 1406 98 %     Weight --      Height --      Head Circumference --      Peak Flow --      Pain Score 05/06/23 1407 8     Pain Loc --      Pain Education --      Exclude from Growth Chart --     Most recent vital signs: Vitals:   05/06/23 1406  BP: (!) 116/93  Pulse: 94  Resp: 18  Temp: 97.9 F (36.6 C)  SpO2: 98%   Physical Exam: I have reviewed the vital signs and nursing notes. General: Awake, alert, no acute distress.  Nontoxic appearing. Head:  Atraumatic, normocephalic.   ENT:  EOM intact, PERRL. Oral mucosa is pink and moist with no lesions. Neck: Neck is supple with full range of motion, No meningeal signs. Cardiovascular:  RRR, No murmurs. Peripheral pulses palpable and equal bilaterally. Respiratory:  Symmetrical chest wall expansion.  No rhonchi, rales, or wheezes.  Good air movement throughout.  No use of accessory muscles.   Musculoskeletal:  No cyanosis or edema. Moving  extremities with full ROM Abdomen:  Soft, nontender, nondistended. Neuro:  GCS 15, moving all four extremities, interacting appropriately. Speech clear.  5 out of 5 strength present in bilateral upper and lower extremities.  Sensation equal and intact in bilateral upper and lower extremities.  Cranial nerves II through XII intact. Psych:  Calm, appropriate.   Skin:  Warm, dry, no rash.    ED Results / Procedures / Treatments   Labs (all labs ordered are listed, but only abnormal results are displayed) Labs Reviewed  BASIC METABOLIC PANEL - Abnormal; Notable for the following components:      Result Value   Glucose, Bld 110 (*)    All other components within normal limits  CBC WITH DIFFERENTIAL/PLATELET     EKG    RADIOLOGY    PROCEDURES:  Critical Care performed: No  Procedures   MEDICATIONS ORDERED IN ED: Medications  lactated ringers bolus 1,000 mL (1,000 mLs Intravenous New Bag/Given 05/06/23 1445)  ketorolac (TORADOL) 15 MG/ML injection 15 mg (15 mg Intravenous Given 05/06/23 1442)  prochlorperazine (COMPAZINE) injection 5 mg (5 mg Intravenous Given 05/06/23 1442)  diphenhydrAMINE (BENADRYL) injection 50 mg (50  mg Intravenous Given 05/06/23 1442)     IMPRESSION / MDM / ASSESSMENT AND PLAN / ED COURSE  I reviewed the triage vital signs and the nursing notes.                              Differential diagnosis includes, but is not limited to, complex migraine, dehydration, cluster headache.  Patient's presentation is most consistent with severe exacerbation of chronic illness.  Patient is a 57 year old male with chronic migraines presenting today for his typical migraine that has not resolved with his normal medications.  Migraine symptoms consistent with his prior episodes.  No new neurological symptoms.  Vital signs otherwise stable.  Laboratory workup reassuring.  Patient was given migraine cocktail with 1 L fluids, Toradol, Compazine, and Benadryl with complete  symptomatic resolution.  Safe for discharge at this time and follow-up with his neurologist for ongoing migraine management.  Clinical Course as of 05/06/23 1544  Sun May 06, 2023  1458 Basic metabolic panelTheda Sers [DW]  [phone] Reassessed patient.  Symptoms have resolved at this time.  Will discharge with ongoing outpatient follow-up for his chronic migraines. [DW]    Clinical Course User Index [DW] Janith Lima, MD     FINAL CLINICAL IMPRESSION(S) / ED DIAGNOSES   Final diagnoses:  Intractable chronic migraine with aura with status migrainosus     Rx / DC Orders   ED Discharge Orders     None        Note:  This document was prepared using Dragon voice recognition software and may include unintentional dictation errors.   Janith Lima, MD 05/06/23 [phone]

## 2023-05-06 NOTE — ED Triage Notes (Addendum)
Pt here with a migraine since yesterday. Pt states he has a hx of complex migraines but cannot get rid of this one. Pt had nausea and vomiting yesterday but not today. Pt also having dizziness.

## 2023-05-06 NOTE — Discharge Instructions (Signed)
Please follow-up with your neurology and primary care provider for ongoing management of your chronic migraines.

## 2023-06-03 ENCOUNTER — Ambulatory Visit
Admission: RE | Admit: 2023-06-03 | Discharge: 2023-06-03 | Disposition: A | Discharge status: Home / Self Care | Payer: Commercial Managed Care - PPO | Source: Ambulatory Visit

## 2023-06-03 VITALS — BP 120/85 | HR 100 | Temp 98.6°F | Resp 20 | Ht 67.0 in | Wt 194.0 lb

## 2023-06-03 DIAGNOSIS — J069 Acute upper respiratory infection, unspecified: Secondary | ICD-10-CM | POA: Diagnosis not present

## 2023-06-03 MED ORDER — PREDNISONE 20 MG PO TABS: 40.0000 mg | ORAL_TABLET | Freq: Every day | ORAL | 0 refills | Status: DC

## 2023-06-03 MED ORDER — PROMETHAZINE-DM 6.25-15 MG/5ML PO SYRP: 5.0000 mL | ORAL_SOLUTION | Freq: Every evening | ORAL | 0 refills | Status: DC | PRN

## 2023-06-03 MED ORDER — AZITHROMYCIN 250 MG PO TABS: 250.0000 mg | ORAL_TABLET | Freq: Every day | ORAL | 0 refills | Status: DC

## 2023-06-03 NOTE — Discharge Instructions (Signed)
Begin azithromycin to provide coverage for bacteria that is most likely causing symptoms to prolong  Begin prednisone every morning with food for 5 days to open and relax airway should settle harshness of coughing  May use cough syrup at bedtime to allow you to rest comfortably  May continue use of DayQuil or attempt any of the following below    You can take Tylenol and/or Ibuprofen as needed for fever reduction and pain relief.   For cough: honey 1/2 to 1 teaspoon (you can dilute the honey in water or another fluid).  You can also use guaifenesin and dextromethorphan for cough. You can use a humidifier for chest congestion and cough.  If you don't have a humidifier, you can sit in the bathroom with the hot shower running.      For sore throat: try warm salt water gargles, cepacol lozenges, throat spray, warm tea or water with lemon/honey, popsicles or ice, or OTC cold relief medicine for throat discomfort.   For congestion: take a daily anti-histamine like Zyrtec, Claritin, and a oral decongestant, such as pseudoephedrine.  You can also use Flonase 1-2 sprays in each nostril daily.   It is important to stay hydrated: drink plenty of fluids (water, gatorade/powerade/pedialyte, juices, or teas) to keep your throat moisturized and help further relieve irritation/discomfort.

## 2023-06-03 NOTE — ED Provider Notes (Signed)
Eric Garza    CSN: [id number] Arrival date & time: 06/03/23  4098      History   Chief Complaint Chief Complaint  Patient presents with   Cough    Low grade fever this has been going on for a week. I've been using DayQuil and Nightquil not getting better. History of bronchitis and pneumonia. - Entered by patient   Fatigue    HPI Eric Garza is a 57 y.o. male.   Patient presents for evaluation of a subjective low-grade fever, nasal and chest congestion and a nonproductive cough persisting for 2 weeks.  Intermittent left-sided ear pain associated.  Has been using DayQuil and NyQuil which has provided minimal relief.  Known sick contacts in household and at work.  History of bronchitis and pneumonia.  Daily tobacco use.  Denies shortness of breath and wheezing, abdominal symptoms.  Chest x-ray completed 1 month ago to monitor for COPD by PCP negative.  Past Medical History:  Diagnosis Date   Depression    HLD (hyperlipidemia)    Migraine    Pre-diabetes    Tobacco abuse     Patient Active Problem List   Diagnosis Date Noted   Acute metabolic encephalopathy 05/26/2022   HLD (hyperlipidemia) 05/26/2022   Depression 05/26/2022   Tobacco abuse 05/26/2022   Aspiration pneumonia (HCC) 05/26/2022   Polypharmacy_possible polypharmacy 05/26/2022   Encounter for screening colonoscopy     Past Surgical History:  Procedure Laterality Date   COLONOSCOPY WITH PROPOFOL N/A 06/27/2019   Procedure: COLONOSCOPY WITH PROPOFOL;  Surgeon: Toney Reil, MD;  Location: [facility] ENDOSCOPY;  Service: Gastroenterology;  Laterality: N/A;       Home Medications    Prior to Admission medications   Medication Sig Start Date End Date Taking? Authorizing Provider  baclofen (LIORESAL) 20 MG tablet Take 20 mg by mouth 3 (three) times daily. 04/24/23  Yes [provider]  albuterol (VENTOLIN HFA) 108 (90 Base) MCG/ACT inhaler SMARTSIG:2 Puff(s) By Mouth Every 4 Hours PRN  01/01/22   [provider]  aspirin EC 81 MG tablet Take by mouth.    [provider]  atorvastatin (LIPITOR) 20 MG tablet Take 20 mg by mouth daily. 02/17/22   [provider]  azithromycin (ZITHROMAX) 250 MG tablet Take 1 tablet (250 mg total) by mouth daily. Take first 2 tablets together, then 1 every day until finished. 06/03/23   Valinda Hoar, NP  butalbital-acetaminophen-caffeine (FIORICET) 50-325-40 MG tablet Take by mouth. 03/14/23   [provider]  cetirizine (ZYRTEC) 10 MG tablet Take 10 mg by mouth daily.    [provider]  dextromethorphan-guaiFENesin (MUCINEX DM) 30-600 MG 12hr tablet Take 1 tablet by mouth 2 (two) times daily as needed for cough. 05/27/22   Rai, Delene Ruffini, MD  loperamide (IMODIUM) 2 MG capsule Take 1 capsule (2 mg total) by mouth 4 (four) times daily as needed for diarrhea or loose stools. 03/29/23   Valinda Hoar, NP  nortriptyline (PAMELOR) 50 MG capsule Take by mouth. 03/14/23   [provider]  ondansetron (ZOFRAN-ODT) 4 MG disintegrating tablet Take 1 tablet (4 mg total) by mouth every 8 (eight) hours as needed for nausea or vomiting. 03/29/23   Valinda Hoar, NP  predniSONE (DELTASONE) 20 MG tablet Take 2 tablets (40 mg total) by mouth daily. 06/03/23   Valinda Hoar, NP  promethazine-dextromethorphan (PROMETHAZINE-DM) 6.25-15 MG/5ML syrup Take 5 mLs by mouth at bedtime as needed for cough. 06/03/23  Valinda Hoar, NP  QUEtiapine (SEROQUEL) 100 MG tablet Take by mouth. 02/26/23   [provider]  sertraline (ZOLOFT) 100 MG tablet Take 100 mg by mouth daily. 02/17/22   [provider]  traZODone (DESYREL) 50 MG tablet Take 150 mg by mouth at bedtime.    [provider]    Family History History reviewed. No pertinent family history.  Social History Social History   Tobacco Use   Smoking status: Every Day    Current packs/day: 1.00    Types: Cigarettes    Smokeless tobacco: Never  Vaping Use   Vaping status: Never Used  Substance Use Topics   Alcohol use: No   Drug use: Never     Allergies   Oxycodone   Review of Systems Review of Systems   Physical Exam Triage Vital Signs ED Triage Vitals  Encounter Vitals Group     BP 06/03/23 0942 120/85     Systolic BP Percentile --      Diastolic BP Percentile --      Pulse Rate 06/03/23 0942 100     Resp 06/03/23 0942 20     Temp 06/03/23 0942 98.6 F (37 C)     Temp Source 06/03/23 0942 Oral     SpO2 06/03/23 0942 98 %     Weight 06/03/23 0939 194 lb (88 kg)     Height 06/03/23 0939 5\' 7"  (1.702 m)     Head Circumference --      Peak Flow --      Pain Score 06/03/23 0938 0     Pain Loc --      Pain Education --      Exclude from Growth Chart --    No data found.  Updated Vital Signs BP 120/85 (BP Location: Left Arm)   Pulse 100   Temp 98.6 F (37 C) (Oral)   Resp 20   Ht 5\' 7"  (1.702 m)   Wt 194 lb (88 kg)   SpO2 98%   BMI 30.38 kg/m   Visual Acuity Right Eye Distance:   Left Eye Distance:   Bilateral Distance:    Right Eye Near:   Left Eye Near:    Bilateral Near:     Physical Exam Constitutional:      Appearance: Normal appearance.  HENT:     Right Ear: Tympanic membrane, ear canal and external ear normal.     Left Ear: Tympanic membrane, ear canal and external ear normal.     Nose: Congestion and rhinorrhea present.     Mouth/Throat:     Mouth: Mucous membranes are moist.     Pharynx: Oropharynx is clear. Posterior oropharyngeal erythema present. No oropharyngeal exudate.  Eyes:     Extraocular Movements: Extraocular movements intact.  Cardiovascular:     Rate and Rhythm: Normal rate and regular rhythm.     Pulses: Normal pulses.     Heart sounds: Normal heart sounds.  Pulmonary:     Effort: Pulmonary effort is normal.     Breath sounds: Normal breath sounds.  Musculoskeletal:     Cervical back: Normal range of motion and neck supple.   Skin:    General: Skin is warm and dry.  Neurological:     Mental Status: He is alert and oriented to person, place, and time. Mental status is at baseline.      UC Treatments / Results  Labs (all labs ordered are listed, but only abnormal results are displayed) Labs  Reviewed - No data to display  EKG   Radiology No results found.  Procedures Procedures (including critical care time)  Medications Ordered in UC Medications - No data to display  Initial Impression / Assessment and Plan / UC Course  I have reviewed the triage vital signs and the nursing notes.  Pertinent labs & imaging results that were available during my care of the patient were reviewed by me and considered in my medical decision making (see chart for details).  Acute URI  Patient is in no signs of distress nor toxic appearing.  Vital signs are stable.  Low suspicion for pneumonia, pneumothorax or bronchitis and therefore will defer imaging.  Symptoms present for 2 weeks without signs of resolution will provide bacterial coverage, prescribed azithromycin and prednisone as he has had success with this in the past, Promethazine DM prescribed for management of cough at nighttime.May use additional over-the-counter medications as needed for supportive care.  May follow-up with urgent care as needed if symptoms persist or worsen.    Final Clinical Impressions(s) / UC Diagnoses   Final diagnoses:  Acute URI     Discharge Instructions      Begin azithromycin to provide coverage for bacteria that is most likely causing symptoms to prolong  Begin prednisone every morning with food for 5 days to open and relax airway should settle harshness of coughing  May use cough syrup at bedtime to allow you to rest comfortably  May continue use of DayQuil or attempt any of the following below    You can take Tylenol and/or Ibuprofen as needed for fever reduction and pain relief.   For cough: honey 1/2 to 1  teaspoon (you can dilute the honey in water or another fluid).  You can also use guaifenesin and dextromethorphan for cough. You can use a humidifier for chest congestion and cough.  If you don't have a humidifier, you can sit in the bathroom with the hot shower running.      For sore throat: try warm salt water gargles, cepacol lozenges, throat spray, warm tea or water with lemon/honey, popsicles or ice, or OTC cold relief medicine for throat discomfort.   For congestion: take a daily anti-histamine like Zyrtec, Claritin, and a oral decongestant, such as pseudoephedrine.  You can also use Flonase 1-2 sprays in each nostril daily.   It is important to stay hydrated: drink plenty of fluids (water, gatorade/powerade/pedialyte, juices, or teas) to keep your throat moisturized and help further relieve irritation/discomfort.    ED Prescriptions     Medication Sig Dispense Auth. Provider   predniSONE (DELTASONE) 20 MG tablet Take 2 tablets (40 mg total) by mouth daily. 10 tablet Salli Quarry R, NP   promethazine-dextromethorphan (PROMETHAZINE-DM) 6.25-15 MG/5ML syrup Take 5 mLs by mouth at bedtime as needed for cough. 118 mL Nola Botkins R, NP   azithromycin (ZITHROMAX) 250 MG tablet Take 1 tablet (250 mg total) by mouth daily. Take first 2 tablets together, then 1 every day until finished. 6 tablet Valinda Hoar, NP      PDMP not reviewed this encounter.   Valinda Hoar, NP 06/03/23 1011

## 2023-06-03 NOTE — ED Triage Notes (Signed)
Patient states nagging cough for 2 weeks, dry, keeps him up at night, tmax 99

## 2023-11-30 ENCOUNTER — Emergency Department
Admission: EM | Admit: 2023-11-30 | Discharge: 2023-11-30 | Disposition: A | Discharge status: Home / Self Care | Payer: Worker's Compensation | Attending: Emergency Medicine | Admitting: Emergency Medicine

## 2023-11-30 ENCOUNTER — Other Ambulatory Visit: Payer: Self-pay

## 2023-11-30 ENCOUNTER — Inpatient Hospital Stay
Admission: RE | Admit: 2023-11-30 | Discharge: 2023-11-30 | Disposition: A | Discharge status: Home / Self Care | Source: Ambulatory Visit | Attending: Internal Medicine | Admitting: Internal Medicine

## 2023-11-30 ENCOUNTER — Encounter: Payer: Self-pay | Admitting: Intensive Care

## 2023-11-30 DIAGNOSIS — M549 Dorsalgia, unspecified: Secondary | ICD-10-CM | POA: Diagnosis present

## 2023-11-30 DIAGNOSIS — F17200 Nicotine dependence, unspecified, uncomplicated: Secondary | ICD-10-CM | POA: Insufficient documentation

## 2023-11-30 DIAGNOSIS — X500XXA Overexertion from strenuous movement or load, initial encounter: Secondary | ICD-10-CM | POA: Insufficient documentation

## 2023-11-30 DIAGNOSIS — Y990 Civilian activity done for income or pay: Secondary | ICD-10-CM | POA: Diagnosis not present

## 2023-11-30 DIAGNOSIS — S39012A Strain of muscle, fascia and tendon of lower back, initial encounter: Secondary | ICD-10-CM | POA: Insufficient documentation

## 2023-11-30 DIAGNOSIS — T148XXA Other injury of unspecified body region, initial encounter: Secondary | ICD-10-CM

## 2023-11-30 MED ORDER — METHOCARBAMOL 500 MG PO TABS
1000.0000 mg | ORAL_TABLET | Freq: Once | ORAL | Status: AC
  Administered 2023-11-30: 1000 mg via ORAL
  Filled 2023-11-30: qty 2

## 2023-11-30 MED ORDER — METHOCARBAMOL 500 MG PO TABS: 500.0000 mg | ORAL_TABLET | Freq: Four times a day (QID) | ORAL | 0 refills | Status: AC

## 2023-11-30 MED ORDER — KETOROLAC TROMETHAMINE 30 MG/ML IJ SOLN
30.0000 mg | Freq: Once | INTRAMUSCULAR | Status: AC
  Administered 2023-11-30: 30 mg via INTRAMUSCULAR
  Filled 2023-11-30: qty 1

## 2023-11-30 MED ORDER — LIDOCAINE 5 % EX PTCH
1.0000 | MEDICATED_PATCH | CUTANEOUS | Status: DC
  Administered 2023-11-30: 1 via TRANSDERMAL
  Filled 2023-11-30: qty 1

## 2023-11-30 MED ORDER — TIZANIDINE HCL 2 MG PO TABS: 2.0000 mg | ORAL_TABLET | Freq: Once | ORAL | Status: DC

## 2023-11-30 MED ORDER — METHOCARBAMOL 500 MG PO TABS: 500.0000 mg | ORAL_TABLET | Freq: Once | ORAL | Status: DC

## 2023-11-30 NOTE — Discharge Instructions (Signed)
 Alternate taking Tylenol  and ibuprofen for pain as needed.  Do not take baclofen with new prescription of Robaxin  which is a muscle relaxer.  You are encouraged deep massages to this area to relieve tension and application of a warm heating pad.  Get plenty of rest.

## 2023-11-30 NOTE — ED Provider Notes (Signed)
 [facility] Emergency Department Provider Note     Event Date/Time   First MD Initiated Contact with Patient 11/30/23 1102     (approximate)   History   Back Pain   HPI  Eric Garza is a 58 y.o. male with a past medical history of HLD, prediabetes and tobacco use presents to the ED for evaluation of right sided mid back pain sustained at work yesterday. 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 He has taken Advil and baclofen with no relief. Denies loss of bladder or bowel control and leg weakness.     Physical Exam   Triage Vital Signs: ED Triage Vitals  Encounter Vitals Group     BP 11/30/23 1048 (!) 146/107     Systolic BP Percentile --      Diastolic BP Percentile --      Pulse Rate 11/30/23 1048 77     Resp 11/30/23 1048 16     Temp 11/30/23 1048 97.7 F (36.5 C)     Temp Source 11/30/23 1048 Oral     SpO2 11/30/23 1048 97 %     Weight 11/30/23 1049 187 lb (84.8 kg)     Height 11/30/23 1049 5\' 7"  (1.702 m)     Head Circumference --      Peak Flow --      Pain Score 11/30/23 1049 7     Pain Loc --      Pain Education --      Exclude from Growth Chart --     Most recent vital signs: Vitals:   11/30/23 1048  BP: (!) 146/107  Pulse: 77  Resp: 16  Temp: 97.7 F (36.5 C)  SpO2: 97%    General: Well appearing. Alert and oriented. INAD.  Skin:  Warm, dry and intact. No rashes or lesions noted.     Head:  NCAT.  Neck:   No cervical spine tenderness to palpation. Full ROM without difficulty.  CV:  Good peripheral perfusion. RESP:  Normal effort.  BACK:  Spinous process is midline without deformity or tenderness. Tenderness to right sided mid back paraspinal muscles. NEURO: Cranial nerves intact. No focal deficits. Sensation and motor function intact. Gait is steady.    ED Results / Procedures / Treatments   Labs (all labs ordered are listed, but only abnormal results are displayed) Labs  Reviewed - No data to display  No results found.  PROCEDURES:  Critical Care performed: No  Procedures   MEDICATIONS ORDERED IN ED: Medications  lidocaine  (LIDODERM ) 5 % 1 patch (1 patch Transdermal Patch Applied 11/30/23 1147)  ketorolac  (TORADOL ) 30 MG/ML injection 30 mg (30 mg Intramuscular Given 11/30/23 1146)  methocarbamol  (ROBAXIN ) tablet 1,000 mg (1,000 mg Oral Given 11/30/23 1145)     IMPRESSION / MDM / ASSESSMENT AND PLAN / ED COURSE  I reviewed the triage vital signs and the nursing notes.                               58 y.o. male presents to the emergency department for evaluation and treatment of . See HPI for further details.   Differential diagnosis includes, but is not limited to muscle strain, muscle spasm, contusion  Patient's presentation is most consistent with acute complicated illness / injury requiring diagnostic workup.  Patient is alert and oriented.  He is hemodynamically  stable.  Physical exam findings are as stated above.  No red flag signs.  No indication for imaging at this time as there is no midline tenderness of the spinous process.  ED pain management with Toradol , tizanidine and lidocaine  patch.  Work note requested from patient and provided.  Patient is in stable condition for discharge home.  Encouraged to follow-up with his primary care for further management as needed.  ED return precautions discussed thoroughly.   FINAL CLINICAL IMPRESSION(S) / ED DIAGNOSES   Final diagnoses:  Muscle strain   Rx / DC Orders   ED Discharge Orders          Ordered    methocarbamol  (ROBAXIN ) 500 MG tablet  4 times daily        11/30/23 1218           Note:  This document was prepared using Dragon voice recognition software and may include unintentional dictation errors.    Phyllis Breeze, Shalona Harbour A, PA-C 11/30/23 1259    Arline Bennett, MD 11/30/23 [phone]

## 2023-11-30 NOTE — ED Triage Notes (Signed)
 Patient reports while at work yesterday he hurt his back. Denies previous back issues.

## 2024-05-06 ENCOUNTER — Other Ambulatory Visit: Payer: Self-pay | Admitting: Internal Medicine

## 2024-05-06 DIAGNOSIS — S22000A Wedge compression fracture of unspecified thoracic vertebra, initial encounter for closed fracture: Secondary | ICD-10-CM

## 2024-06-05 ENCOUNTER — Ambulatory Visit
Admission: RE | Admit: 2024-06-05 | Discharge: 2024-06-05 | Disposition: A | Discharge status: Home / Self Care | Source: Ambulatory Visit | Attending: Emergency Medicine | Admitting: Emergency Medicine

## 2024-06-05 VITALS — BP 125/85 | HR 91 | Temp 99.0°F | Resp 20

## 2024-06-05 DIAGNOSIS — J209 Acute bronchitis, unspecified: Secondary | ICD-10-CM

## 2024-06-05 MED ORDER — PROMETHAZINE-DM 6.25-15 MG/5ML PO SYRP: 5.0000 mL | ORAL_SOLUTION | Freq: Four times a day (QID) | ORAL | 0 refills | Status: AC | PRN

## 2024-06-05 MED ORDER — AZITHROMYCIN 250 MG PO TABS: 250.0000 mg | ORAL_TABLET | Freq: Every day | ORAL | 0 refills | Status: AC

## 2024-06-05 MED ORDER — PREDNISONE 10 MG (21) PO TBPK: ORAL_TABLET | Freq: Every day | ORAL | 0 refills | Status: AC

## 2024-06-05 NOTE — Discharge Instructions (Signed)
 Begin azithromycin  for coverage of bacteria to ensure symptoms do not worsen  Begin prednisone  every morning with food to open and relax the airway, should help with wheezing  You may use cough syrup every 6 hours as needed for additional    You can take Tylenol  as needed for fever reduction and pain relief.   For cough: honey 1/2 to 1 teaspoon (you can dilute the honey in water or another fluid).  You can also use guaifenesin  and dextromethorphan for cough. You can use a humidifier for chest congestion and cough.  If you don't have a humidifier, you can sit in the bathroom with the hot shower running.      For sore throat: try warm salt water gargles, cepacol lozenges, throat spray, warm tea or water with lemon/honey, popsicles or ice, or OTC cold relief medicine for throat discomfort.   For congestion: take a daily anti-histamine like Zyrtec, Claritin, and a oral decongestant, such as pseudoephedrine.  You can also use Flonase 1-2 sprays in each nostril daily.   It is important to stay hydrated: drink plenty of fluids (water, gatorade/powerade/pedialyte, juices, or teas) to keep your throat moisturized and help further relieve irritation/discomfort.

## 2024-06-05 NOTE — ED Triage Notes (Signed)
 Patient complains dry cough, body aches and runny nose with clear drainage and chills x 5 days.  Patient has been taking Theraflu with no relief. Patient currently denies any pain.

## 2024-06-05 NOTE — ED Provider Notes (Signed)
 Eric Garza    CSN: [id number] Arrival date & time: 06/05/24  1118      History   Chief Complaint Chief Complaint  Patient presents with   Cough    Had this cough for 5 days. Very congested, feverish hot then cold. Runny nose.body aches. - Entered by patient   Nasal Congestion   Generalized Body Aches   Chills    HPI Eric Garza is a 58 y.o. male.   Patient presents for evaluation of fever peaking at 101, chills, body aches, nasal congestion, nonproductive cough, watery diarrhea and intermittent wheezing present for 5 days.  Tolerable to food and liquids but appetite is decreased.  No known sick contacts prior.  Has attempted use of TheraFlu.  Denies respiratory history, daily tobacco use.  Denies shortness of breath.   Past Medical History:  Diagnosis Date   Depression    HLD (hyperlipidemia)    Migraine    Pre-diabetes    Tobacco abuse     Patient Active Problem List   Diagnosis Date Noted   Acute metabolic encephalopathy 05/26/2022   HLD (hyperlipidemia) 05/26/2022   Depression 05/26/2022   Tobacco abuse 05/26/2022   Aspiration pneumonia (HCC) 05/26/2022   Polypharmacy_possible polypharmacy 05/26/2022   Encounter for screening colonoscopy     Past Surgical History:  Procedure Laterality Date   COLONOSCOPY WITH PROPOFOL  N/A 06/27/2019   Procedure: COLONOSCOPY WITH PROPOFOL ;  Surgeon: Unk Corinn Skiff, MD;  Location: [facility] ENDOSCOPY;  Service: Gastroenterology;  Laterality: N/A;       Home Medications    Prior to Admission medications   Medication Sig Start Date End Date Taking? Authorizing Provider  azithromycin  (ZITHROMAX ) 250 MG tablet Take 1 tablet (250 mg total) by mouth daily. Take first 2 tablets together, then 1 every day until finished. 06/05/24  Yes Nakyra Bourn R, NP  predniSONE  (STERAPRED UNI-PAK 21 TAB) 10 MG (21) TBPK tablet Take by mouth daily. Take 6 tabs by mouth daily  for 1 days, then 5 tabs for 1 days, then 4 tabs for 1  days, then 3 tabs for 1 days, 2 tabs for 1 days, then 1 tab by mouth daily for 1 days 06/05/24  Yes Adalberto Metzgar R, NP  promethazine -dextromethorphan (PROMETHAZINE -DM) 6.25-15 MG/5ML syrup Take 5 mLs by mouth 4 (four) times daily as needed. 06/05/24  Yes Threasa Kinch, Shelba SAUNDERS, NP  albuterol  (VENTOLIN  HFA) 108 (90 Base) MCG/ACT inhaler SMARTSIG:2 Puff(s) By Mouth Every 4 Hours PRN 01/01/22   [provider]  aspirin EC 81 MG tablet Take by mouth.    [provider]  atorvastatin  (LIPITOR) 20 MG tablet Take 20 mg by mouth daily. 02/17/22   [provider]  butalbital -acetaminophen -caffeine  (FIORICET ) 50-325-40 MG tablet Take by mouth. 03/14/23   [provider]  cetirizine (ZYRTEC) 10 MG tablet Take 10 mg by mouth daily.    [provider]  dextromethorphan-guaiFENesin  (MUCINEX  DM) 30-600 MG 12hr tablet Take 1 tablet by mouth 2 (two) times daily as needed for cough. 05/27/22   Rai, Nydia POUR, MD  loperamide  (IMODIUM ) 2 MG capsule Take 1 capsule (2 mg total) by mouth 4 (four) times daily as needed for diarrhea or loose stools. 03/29/23   Tenesha Garza, Shelba SAUNDERS, NP  methocarbamol  (ROBAXIN ) 500 MG tablet Take 1 tablet (500 mg total) by mouth 4 (four) times daily. 11/30/23   Margrette, Myah A, PA-C  nortriptyline (PAMELOR) 50 MG capsule Take by mouth. 03/14/23   [provider]  ondansetron  (ZOFRAN -ODT)  4 MG disintegrating tablet Take 1 tablet (4 mg total) by mouth every 8 (eight) hours as needed for nausea or vomiting. 03/29/23   Teresa Shelba SAUNDERS, NP  QUEtiapine (SEROQUEL) 100 MG tablet Take by mouth. 02/26/23   [provider]  sertraline (ZOLOFT) 100 MG tablet Take 100 mg by mouth daily. 02/17/22   [provider]  traZODone (DESYREL) 50 MG tablet Take 150 mg by mouth at bedtime.    [provider]    Family History History reviewed. No pertinent family history.  Social History Social History   Tobacco Use   Smoking status: Every  Day    Current packs/day: 1.00    Types: Cigarettes   Smokeless tobacco: Never  Vaping Use   Vaping status: Never Used  Substance Use Topics   Alcohol use: No   Drug use: Never     Allergies   Oxycodone   Review of Systems Review of Systems   Physical Exam Triage Vital Signs ED Triage Vitals  Encounter Vitals Group     BP 06/05/24 1206 125/85     Girls Systolic BP Percentile --      Girls Diastolic BP Percentile --      Boys Systolic BP Percentile --      Boys Diastolic BP Percentile --      Pulse Rate 06/05/24 1206 91     Resp 06/05/24 1206 20     Temp 06/05/24 1206 99 F (37.2 C)     Temp Source 06/05/24 1206 Oral     SpO2 06/05/24 1206 96 %     Weight --      Height --      Head Circumference --      Peak Flow --      Pain Score 06/05/24 1209 0     Pain Loc --      Pain Education --      Exclude from Growth Chart --    No data found.  Updated Vital Signs BP 125/85 (BP Location: Left Arm)   Pulse 91   Temp 99 F (37.2 C) (Oral)   Resp 20   SpO2 96%   Visual Acuity Right Eye Distance:   Left Eye Distance:   Bilateral Distance:    Right Eye Near:   Left Eye Near:    Bilateral Near:     Physical Exam Constitutional:      Appearance: Normal appearance.  HENT:     Right Ear: Tympanic membrane, ear canal and external ear normal.     Left Ear: Tympanic membrane, ear canal and external ear normal.     Nose: Congestion present.     Mouth/Throat:     Pharynx: Posterior oropharyngeal erythema present. No oropharyngeal exudate.  Eyes:     Extraocular Movements: Extraocular movements intact.  Cardiovascular:     Rate and Rhythm: Normal rate and regular rhythm.     Pulses: Normal pulses.     Heart sounds: Normal heart sounds.  Pulmonary:     Effort: Pulmonary effort is normal.     Breath sounds: Normal breath sounds.     Comments: Wheezing present with cough Neurological:     Mental Status: He is alert and oriented to person, place, and time.  Mental status is at baseline.      UC Treatments / Results  Labs (all labs ordered are listed, but only abnormal results are displayed) Labs Reviewed - No data to display  EKG   Radiology No  results found.  Procedures Procedures (including critical care time)  Medications Ordered in UC Medications - No data to display  Initial Impression / Assessment and Plan / UC Course  I have reviewed the triage vital signs and the nursing notes.  Pertinent labs & imaging results that were available during my care of the patient were reviewed by me and considered in my medical decision making (see chart for details).  Acute bronchitis  Patient is in no signs of distress nor toxic appearing.  Vital signs are stable.  Low suspicion for pneumonia, pneumothorax or bronchitis and therefore will defer imaging.  Viral testing deferred due to timeline.  Prescribed azithromycin  prednisone  and Promethazine  DM.May use additional over-the-counter medications as needed for supportive care.  May follow-up with urgent care as needed if symptoms persist or worsen.  Note given.   Final Clinical Impressions(s) / UC Diagnoses   Final diagnoses:  Acute bronchitis, unspecified organism     Discharge Instructions      Begin azithromycin  for coverage of bacteria to ensure symptoms do not worsen  Begin prednisone  every morning with food to open and relax the airway, should help with wheezing  You may use cough syrup every 6 hours as needed for additional    You can take Tylenol  as needed for fever reduction and pain relief.   For cough: honey 1/2 to 1 teaspoon (you can dilute the honey in water or another fluid).  You can also use guaifenesin  and dextromethorphan for cough. You can use a humidifier for chest congestion and cough.  If you don't have a humidifier, you can sit in the bathroom with the hot shower running.      For sore throat: try warm salt water gargles, cepacol lozenges, throat spray,  warm tea or water with lemon/honey, popsicles or ice, or OTC cold relief medicine for throat discomfort.   For congestion: take a daily anti-histamine like Zyrtec, Claritin, and a oral decongestant, such as pseudoephedrine.  You can also use Flonase 1-2 sprays in each nostril daily.   It is important to stay hydrated: drink plenty of fluids (water, gatorade/powerade/pedialyte, juices, or teas) to keep your throat moisturized and help further relieve irritation/discomfort.    ED Prescriptions     Medication Sig Dispense Auth. Provider   azithromycin  (ZITHROMAX ) 250 MG tablet Take 1 tablet (250 mg total) by mouth daily. Take first 2 tablets together, then 1 every day until finished. 6 tablet Master Touchet R, NP   predniSONE  (STERAPRED UNI-PAK 21 TAB) 10 MG (21) TBPK tablet Take by mouth daily. Take 6 tabs by mouth daily  for 1 days, then 5 tabs for 1 days, then 4 tabs for 1 days, then 3 tabs for 1 days, 2 tabs for 1 days, then 1 tab by mouth daily for 1 days 21 tablet Zeven Kocak R, NP   promethazine -dextromethorphan (PROMETHAZINE -DM) 6.25-15 MG/5ML syrup Take 5 mLs by mouth 4 (four) times daily as needed. 118 mL Bently Morath, Shelba SAUNDERS, NP      PDMP not reviewed this encounter.   Teresa Shelba SAUNDERS, NP 06/05/24 [phone]
